# Patient Record
Sex: Male | Born: 1965 | Race: White | Hispanic: Yes | Marital: Married | Smoking: Never smoker
Health system: Southern US, Community
[De-identification: ages and names within clinical notes are randomized; demographics above are authoritative.]

## PROBLEM LIST (undated history)

## (undated) DIAGNOSIS — R42 Dizziness and giddiness: Secondary | ICD-10-CM

## (undated) DIAGNOSIS — I10 Essential (primary) hypertension: Secondary | ICD-10-CM

## (undated) HISTORY — PX: WRIST FRACTURE SURGERY: SHX121

## (undated) HISTORY — PX: MANDIBLE SURGERY: SHX707

## (undated) HISTORY — PX: SHOULDER ARTHROSCOPY: SHX128

---

## 2012-08-30 ENCOUNTER — Ambulatory Visit: Payer: Self-pay | Admitting: Family Medicine

## 2014-11-22 ENCOUNTER — Ambulatory Visit: Payer: Self-pay | Admitting: Physician Assistant

## 2015-10-25 ENCOUNTER — Ambulatory Visit
Admission: EM | Admit: 2015-10-25 | Discharge: 2015-10-25 | Disposition: A | Discharge status: Home / Self Care | Payer: 59 | Attending: Emergency Medicine | Admitting: Emergency Medicine

## 2015-10-25 DIAGNOSIS — R109 Unspecified abdominal pain: Secondary | ICD-10-CM | POA: Diagnosis not present

## 2015-10-25 HISTORY — DX: Essential (primary) hypertension: I10

## 2015-10-25 LAB — URINALYSIS COMPLETE WITH MICROSCOPIC (ARMC ONLY)
BACTERIA UA: NONE SEEN
BILIRUBIN URINE: NEGATIVE
Glucose, UA: NEGATIVE mg/dL
Hgb urine dipstick: NEGATIVE
Ketones, ur: NEGATIVE mg/dL
LEUKOCYTES UA: NEGATIVE
Nitrite: NEGATIVE
PH: 6 (ref 5.0–8.0)
Protein, ur: NEGATIVE mg/dL
SQUAMOUS EPITHELIAL / LPF: NONE SEEN
Specific Gravity, Urine: 1.025 (ref 1.005–1.030)
WBC UA: NONE SEEN WBC/hpf (ref 0–5)

## 2015-10-25 MED ORDER — METHOCARBAMOL 750 MG PO TABS: 1500.0000 mg | ORAL_TABLET | Freq: Three times a day (TID) | ORAL | Status: AC

## 2015-10-25 MED ORDER — KETOROLAC TROMETHAMINE 60 MG/2ML IM SOLN
60.0000 mg | Freq: Once | INTRAMUSCULAR | Status: AC
  Administered 2015-10-25: 60 mg via INTRAMUSCULAR

## 2015-10-25 MED ORDER — IBUPROFEN 800 MG PO TABS: 800.0000 mg | ORAL_TABLET | Freq: Three times a day (TID) | ORAL | Status: DC | PRN

## 2015-10-25 NOTE — Discharge Instructions (Signed)
To medication as prescribed. Alternate heat and ice for comfort. Stretch well. Avoid strainuous activity.  Follow-up with your primary care physician this week as needed. Return to urgent care proceed to ER for fever, abdominal pain, difficulty urinating, blood in urine, new or worsening concerns.  Flank Pain Flank pain refers to pain that is located on the side of the body between the upper abdomen and the back. The pain may occur over a short period of time (acute) or may be long-term or reoccurring (chronic). It may be mild or severe. Flank pain can be caused by many things. CAUSES  Some of the more common causes of flank pain include:  Muscle strains.   Muscle spasms.   A disease of your spine (vertebral disk disease).   A lung infection (pneumonia).   Fluid around your lungs (pulmonary edema).   A kidney infection.   Kidney stones.   A very painful skin rash caused by the chickenpox virus (shingles).   Gallbladder disease.  HOME CARE INSTRUCTIONS  Home care will depend on the cause of your pain. In general,  Rest as directed by your caregiver.  Drink enough fluids to keep your urine clear or pale yellow.  Only take over-the-counter or prescription medicines as directed by your caregiver. Some medicines may help relieve the pain.  Tell your caregiver about any changes in your pain.  Follow up with your caregiver as directed. SEEK IMMEDIATE MEDICAL CARE IF:   Your pain is not controlled with medicine.   You have new or worsening symptoms.  Your pain increases.   You have abdominal pain.   You have shortness of breath.   You have persistent nausea or vomiting.   You have swelling in your abdomen.   You feel faint or pass out.   You have blood in your urine.  You have a fever or persistent symptoms for more than 2-3 days.  You have a fever and your symptoms suddenly get worse. MAKE SURE YOU:   Understand these instructions.  Will watch  your condition.  Will get help right away if you are not doing well or get worse.   This information is not intended to replace advice given to you by your health care provider. Make sure you discuss any questions you have with your health care provider.   Document Released: 12/14/2005 Document Revised: 07/17/2012 Document Reviewed: 06/06/2012 Elsevier Interactive Patient Education [employer]2016 Elsevier Inc.

## 2015-10-25 NOTE — ED Notes (Signed)
Started approximately 9am today with right lateral abdomen/side pain, non radiating. Worse with any movement to the right.

## 2015-10-25 NOTE — ED Provider Notes (Signed)
[facility]  ____________________________________________  Time seen: Approximately 8:17 PM  I have reviewed the triage vital signs and the nursing notes.   HISTORY  Chief Complaint Abdominal Pain  HPI Justin Donnangel Beveridge Jr. is a 49 y.o. male  presents for the complaint of right flank pain since approximately 9 AM this morning. Patient states the pain is only in the right flank and does not radiate. Patient denies fall or known trauma. 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 Patient reports that he was at work at a onset of pain. Patient states he does not remember if some pain was present this morning when he woke up or not.  Patient states at this time when standing still pain is 0 out of 10. However reports with right twisting and bending forward pain is sharp and stabbing at 9 out of 10. Denies history of similar.  Denies abdominal pain, dysuria, fevers, recent sickness, hematuria, penile drainage, penile pain, testicular pain, rectal pain. Denies nausea, vomiting, diarrhea or changes in stool coloration, consistency. Denies rectal bleeding, blood in stool or blood in toilet.   Past Medical History  Diagnosis Date  . Hypertension     There are no active problems to display for this patient.   History reviewed. No pertinent past surgical history.  Current Outpatient Rx  Name  Route  Sig  Dispense  Refill  . fexofenadine (ALLEGRA) 30 MG tablet   Oral   Take 30 mg by mouth 2 (two) times daily.         . fluticasone (FLONASE) 50 MCG/ACT nasal spray   Each Nare   Place into both nostrils daily.         Marland Kitchen. triamterene-hydrochlorothiazide (DYAZIDE) 37.5-25 MG capsule   Oral   Take 1 capsule by mouth daily.           Allergies Shellfish allergy  History reviewed. No pertinent family history.  Social History Social History  Substance Use Topics  . Smoking status: Never Smoker    . Smokeless tobacco: None  . Alcohol Use: No    Review of Systems Constitutional: No fever/chills Eyes: No visual changes. ENT: No sore throat. Cardiovascular: Denies chest pain. Respiratory: Denies shortness of breath. Gastrointestinal: No abdominal pain.  No nausea, no vomiting.  No diarrhea.  No constipation. Positive right flank pain. Genitourinary: Negative for dysuria. Musculoskeletal: Negative for back pain. Skin: Negative for rash. Neurological: Negative for headaches, focal weakness or numbness.  10-point ROS otherwise negative.  ____________________________________________   PHYSICAL EXAM:  VITAL SIGNS: ED Triage Vitals  Enc Vitals Group     BP 10/25/15 1923 138/84 mmHg     Pulse Rate 10/25/15 1923 70     Resp 10/25/15 1923 16     Temp 10/25/15 1923 98.5 F (36.9 C)     Temp Source 10/25/15 1923 Tympanic     SpO2 10/25/15 1923 98 %     Weight 10/25/15 1923 212 lb (96.163 kg)     Height 10/25/15 1923 5\' 11"  (1.803 m)     Head Cir --      Peak Flow --      Pain Score 10/25/15 1927 10     Pain Loc --      Pain Edu? --      Excl. in GC? --     Constitutional: Alert and oriented. Well appearing and in no acute distress. Eyes: Conjunctivae are normal.  PERRL. EOMI. Head: Atraumatic.  Nose: No congestion/rhinnorhea.  Mouth/Throat: Mucous membranes are moist.  Oropharynx non-erythematous. Neck: No stridor.  No cervical spine tenderness to palpation. Hematological/Lymphatic/Immunilogical: No cervical lymphadenopathy. Cardiovascular: Normal rate, regular rhythm. Grossly normal heart sounds.  Good peripheral circulation. Respiratory: Normal respiratory effort.  No retractions. Lungs CTAB. No wheezes, rales or rhonchi. Gastrointestinal: Soft and nontender. No distention. Normal Bowel sounds.  Musculoskeletal: No lower or upper extremity tenderness nor edema.   Bilateral pedal pulses equal and easily palpated.  No cervical, thoracic or lumbar tenderness to  palpation. No midline tenderness. Right posterior to lateral mid thoracic mild tender to palpation, the pain in same area increases with right rotation and forward bending. No saddle anesthesia. Bilateral straight leg test negative. Steady gait. Changes positions from lying to sitting to standing quickly in room without distress. Neurologic:  Normal speech and language. No gross focal neurologic deficits are appreciated. No gait instability. Skin:  Skin is warm, dry and intact. No rash noted. Psychiatric: Mood and affect are normal. Speech and behavior are normal.  ____________________________________________   LABS (all labs ordered are listed, but only abnormal results are displayed)  Labs Reviewed  URINALYSIS COMPLETEWITH MICROSCOPIC ([facility] ONLY)     INITIAL IMPRESSION / ASSESSMENT AND PLAN / ED COURSE  Pertinent labs & imaging results that were available during my care of the patient were reviewed by me and considered in my medical decision making (see chart for details).   Very well-appearing patient. No acute distress. Presents for the complaint of right flank pain since 9 AM this morning. Denies fall or trauma. Denies abdominal pain, dysuria, fever or other complaints. Patient reports pain is only present with movement. Denies pain radiation. Patient states that if he sits still or stand still he has 0 pain. Pain is point reproducible by palpation as well as with movement per patient. No midline tenderness. Lungs clear throughout. Abdomen soft and nontender. Will evaluate urinalysis to evaluate for any bacteria as well as RBCs for hemoglobin. Suspect muscular strain. Doubt intraabdominal cause or nephrolithiasis as pain causes.   Urinalysis reviewed. Urinalysis normal. No RBCs or hemoglobin. Suspect muscular strain.  60 mg Toradol 1 urgent care. Discussed with patient to rest, ice, stretch and avoidance of overly strenuous activity. Patient does report that history of substance abuse  approximately 20 years ago which included narcotics as well as alcohol. Discussed patient use of muscle relaxants as needed for pain. Patient reports he is taking muscle relaxants in the past and tolerated them well. Will treat with when necessary ibuprofen as well as Robaxin and supportive treatments. Post IM toradol reports much improved and pain 0/10.   Discussed follow up with Primary care physician this week. Discussed follow up and return parameters including no resolution or any worsening concerns. Patient verbalized understanding and agreed to plan.   ____________________________________________   FINAL CLINICAL IMPRESSION(S) / ED DIAGNOSES  Final diagnoses:  Right flank pain       Renford Dills, NP 10/25/15 2108  Renford Dills, NP 10/25/15 2108

## 2015-11-10 ENCOUNTER — Encounter: Payer: Self-pay | Admitting: *Deleted

## 2015-11-10 ENCOUNTER — Ambulatory Visit
Admission: EM | Admit: 2015-11-10 | Discharge: 2015-11-10 | Disposition: A | Discharge status: Home / Self Care | Payer: 59 | Attending: Family Medicine | Admitting: Family Medicine

## 2015-11-10 DIAGNOSIS — S39012A Strain of muscle, fascia and tendon of lower back, initial encounter: Secondary | ICD-10-CM

## 2015-11-10 MED ORDER — KETOROLAC TROMETHAMINE 60 MG/2ML IM SOLN
60.0000 mg | Freq: Once | INTRAMUSCULAR | Status: AC
  Administered 2015-11-10: 60 mg via INTRAMUSCULAR

## 2015-11-10 MED ORDER — CYCLOBENZAPRINE HCL 10 MG PO TABS: 10.0000 mg | ORAL_TABLET | Freq: Three times a day (TID) | ORAL | Status: DC | PRN

## 2015-11-10 MED ORDER — IBUPROFEN 800 MG PO TABS: 800.0000 mg | ORAL_TABLET | Freq: Three times a day (TID) | ORAL | Status: DC

## 2015-11-10 NOTE — ED Notes (Signed)
Patient was working under a vehicle today at [employer] and when he stood up is lower back went out completely. Patient does have a history of back problems.

## 2015-11-10 NOTE — ED Provider Notes (Signed)
CSN: [id number]     Arrival date & time 11/10/15  1706 History   First MD Initiated Contact with Patient 11/10/15 1846     Chief Complaint  Patient presents with  . Back Pain   (Consider location/radiation/quality/duration/timing/severity/associated sxs/prior Treatment) Patient is a 50 y.o. male presenting with back pain. The history is provided by the patient.  Back Pain Location:  Lumbar spine Quality:  Aching Radiates to:  Does not radiate (denies radiating, numbness or tingling) Pain severity:  Severe Onset quality:  Sudden Duration:  4 hours Timing:  Constant Chronicity:  New Context: twisting   Context comment:  States he had just stood up from being in a laying down position on his back; had turned to start walking a felt sudden onset pain Associated symptoms: no abdominal pain, no abdominal swelling, no bladder incontinence, no bowel incontinence, no dysuria, no fever, no leg pain, no numbness, no paresthesias, no perianal numbness, no tingling and no weakness     Past Medical History  Diagnosis Date  . Hypertension    History reviewed. No pertinent past surgical history. History reviewed. No pertinent family history. Social History  Substance Use Topics  . Smoking status: Never Smoker   . Smokeless tobacco: Never Used  . Alcohol Use: No    Review of Systems  Constitutional: Negative for fever.  Gastrointestinal: Negative for abdominal pain and bowel incontinence.  Genitourinary: Negative for bladder incontinence and dysuria.  Musculoskeletal: Positive for back pain.  Neurological: Negative for tingling, weakness, numbness and paresthesias.    Allergies  Shellfish allergy  Home Medications   Prior to Admission medications   Medication Sig Start Date End Date Taking? Authorizing Provider  fexofenadine (ALLEGRA) 30 MG tablet Take 30 mg by mouth 2 (two) times daily.   Yes Historical Provider, MD  fluticasone (FLONASE) 50 MCG/ACT nasal spray Place into both  nostrils daily.   Yes Historical Provider, MD  triamterene-hydrochlorothiazide (DYAZIDE) 37.5-25 MG capsule Take 1 capsule by mouth daily.   Yes Historical Provider, MD  cyclobenzaprine (FLEXERIL) 10 MG tablet Take 1 tablet (10 mg total) by mouth 3 (three) times daily as needed for muscle spasms. 11/10/15   [name], MD  ibuprofen (ADVIL,MOTRIN) 800 MG tablet Take 1 tablet (800 mg total) by mouth 3 (three) times daily. 11/10/15   [name], MD   Meds Ordered and Administered this Visit   Medications  ketorolac (TORADOL) injection 60 mg (60 mg Intramuscular Given 11/10/15 1910)    BP 117/76 mmHg  Pulse 60  Temp(Src) 98.2 F (36.8 C) (Oral)  Resp 20  Ht 5\' 11"  (1.803 m)  Wt 212 lb (96.163 kg)  BMI 29.58 kg/m2  SpO2 100% No data found.   Physical Exam  Constitutional: He appears well-developed and well-nourished. No distress.  Musculoskeletal:       Lumbar back: He exhibits decreased range of motion, tenderness and spasm. He exhibits no bony tenderness, no swelling, no edema, no deformity, no laceration, no pain and normal pulse.  Skin: He is not diaphoretic.  Nursing note and vitals reviewed.   ED Course  Procedures (including critical care time)  Labs Review Labs Reviewed - No data to display  Imaging Review No results found.   Visual Acuity Review  Right Eye Distance:   Left Eye Distance:   Bilateral Distance:    Right Eye Near:   Left Eye Near:    Bilateral Near:         MDM   1. Low back  strain, initial encounter    Discharge Medication List as of 11/10/2015  7:27 PM    START taking these medications   Details  cyclobenzaprine (FLEXERIL) 10 MG tablet Take 1 tablet (10 mg total) by mouth 3 (three) times daily as needed for muscle spasms., Starting 11/10/2015, Until Discontinued, Normal       Discharge Medication List as of 11/10/2015  7:27 PM    START taking these medications   Details  cyclobenzaprine (FLEXERIL) 10 MG tablet Take 1 tablet (10 mg  total) by mouth 3 (three) times daily as needed for muscle spasms., Starting 11/10/2015, Until Discontinued, Normal       1. diagnosis reviewed with patient 2. Patient given Toradol 60mg  IM x1 in clinic 3.  rx as per orders above; reviewed possible side effects, interactions, risks and benefits  4. Recommend supportive treatment with rest, gentle stretching, heat 5. Follow-up prn if symptoms worsen or don't improve    Payton Mccallumrlando Hilberto Burzynski, MD 11/10/15 1932

## 2015-12-21 ENCOUNTER — Ambulatory Visit
Admission: EM | Admit: 2015-12-21 | Discharge: 2015-12-21 | Disposition: A | Discharge status: Home / Self Care | Payer: 59 | Attending: Family Medicine | Admitting: Family Medicine

## 2015-12-21 DIAGNOSIS — G44209 Tension-type headache, unspecified, not intractable: Secondary | ICD-10-CM | POA: Diagnosis not present

## 2015-12-21 DIAGNOSIS — B349 Viral infection, unspecified: Secondary | ICD-10-CM | POA: Diagnosis not present

## 2015-12-21 LAB — RAPID INFLUENZA A&B ANTIGENS (ARMC ONLY): INFLUENZA A (ARMC): NOT DETECTED

## 2015-12-21 LAB — RAPID INFLUENZA A&B ANTIGENS: Influenza B (ARMC): NOT DETECTED

## 2015-12-21 MED ORDER — KETOROLAC TROMETHAMINE 60 MG/2ML IM SOLN
60.0000 mg | Freq: Once | INTRAMUSCULAR | Status: AC
  Administered 2015-12-21: 60 mg via INTRAMUSCULAR

## 2015-12-21 MED ORDER — CYCLOBENZAPRINE HCL 10 MG PO TABS: 10.0000 mg | ORAL_TABLET | Freq: Every day | ORAL | Status: DC

## 2015-12-21 MED ORDER — IBUPROFEN 800 MG PO TABS: 800.0000 mg | ORAL_TABLET | Freq: Three times a day (TID) | ORAL | Status: DC

## 2015-12-21 NOTE — ED Notes (Signed)
Started yesterday morning with "my bones aching and my head feels like it's being squeezed"

## 2015-12-21 NOTE — ED Provider Notes (Signed)
CSN: [id number]     Arrival date & time 12/21/15  1740 History   First MD Initiated Contact with Patient 12/21/15 1818     Chief Complaint  Patient presents with  . Headache   (Consider location/radiation/quality/duration/timing/severity/associated sxs/prior Treatment) HPI Comments: 50 yo male with a 1 day h/o joint, body and muscle aches, mainly to his legs, fatigue, chills, headache. Headache is all around the head. Denies "worst headache of his life", vision changes, nausea, vomiting, numbness/tingling, chest pains, shortness of breath.   The history is provided by the patient.    Past Medical History  Diagnosis Date  . Hypertension    History reviewed. No pertinent past surgical history. Family History  Problem Relation Age of Onset  . Diabetes Mother    Social History  Substance Use Topics  . Smoking status: Never Smoker   . Smokeless tobacco: Never Used  . Alcohol Use: No    Review of Systems  Allergies  Shellfish allergy  Home Medications   Prior to Admission medications   Medication Sig Start Date End Date Taking? Authorizing Provider  fexofenadine (ALLEGRA) 30 MG tablet Take 30 mg by mouth 2 (two) times daily.   Yes Historical Provider, MD  fluticasone (FLONASE) 50 MCG/ACT nasal spray Place into both nostrils daily.   Yes Historical Provider, MD  triamterene-hydrochlorothiazide (DYAZIDE) 37.5-25 MG capsule Take 1 capsule by mouth daily.   Yes Historical Provider, MD  cyclobenzaprine (FLEXERIL) 10 MG tablet Take 1 tablet (10 mg total) by mouth at bedtime. prn 12/21/15   [name], MD  ibuprofen (ADVIL,MOTRIN) 800 MG tablet Take 1 tablet (800 mg total) by mouth 3 (three) times daily. 12/21/15   [name], MD   Meds Ordered and Administered this Visit   Medications  ketorolac (TORADOL) injection 60 mg (60 mg Intramuscular Given 12/21/15 1839)    BP 127/84 mmHg  Pulse 70  Temp(Src) 99.4 F (37.4 C) (Tympanic)  Resp 16  Ht  (1.778 m)  Wt 210 lb  (95.255 kg)  BMI 30.13 kg/m2  SpO2 100% No data found.   Physical Exam  Constitutional: He appears well-developed and well-nourished. No distress.  HENT:  Head: Normocephalic and atraumatic.  Right Ear: Tympanic membrane, external ear and ear canal normal.  Left Ear: Tympanic membrane, external ear and ear canal normal.  Nose: Nose normal.  Mouth/Throat: Uvula is midline, oropharynx is clear and moist and mucous membranes are normal. No oropharyngeal exudate or tonsillar abscesses.  Eyes: Conjunctivae and EOM are normal. Pupils are equal, round, and reactive to light. Right eye exhibits no discharge. Left eye exhibits no discharge. No scleral icterus.  Neck: Normal range of motion. Neck supple. No tracheal deviation present. No thyromegaly present.  Cardiovascular: Normal rate, regular rhythm and normal heart sounds.   Pulmonary/Chest: Effort normal and breath sounds normal. No stridor. No respiratory distress. He has no wheezes. He has no rales. He exhibits no tenderness.  Lymphadenopathy:    He has no cervical adenopathy.  Neurological: He is alert.  Skin: Skin is warm and dry. No rash noted. He is not diaphoretic.  Nursing note and vitals reviewed.   ED Course  Procedures (including critical care time)  Labs Review Labs Reviewed  RAPID INFLUENZA A&B ANTIGENS [facility] ONLY)    Imaging Review No results found.   Visual Acuity Review  Right Eye Distance:   Left Eye Distance:   Bilateral Distance:    Right Eye Near:   Left Eye Near:  Bilateral Near:         MDM   1. Tension-type headache, not intractable, unspecified chronicity pattern   2. Viral syndrome    Discharge Medication List as of 12/21/2015  7:13 PM      1. Lab results and diagnosis reviewed with patient 2. rx as per orders above; reviewed possible side effects, interactions, risks and benefits; rx given for ibuprofen  and flexeril  as per orders 3. Recommend supportive treatment with  increased fluids, rest  4. Follow-up prn if symptoms worsen or don't improve  [name], MD 12/21/15 2053

## 2015-12-22 ENCOUNTER — Emergency Department: Payer: 59

## 2015-12-22 ENCOUNTER — Emergency Department
Admission: EM | Admit: 2015-12-22 | Discharge: 2015-12-23 | Disposition: A | Discharge status: Home / Self Care | Payer: 59 | Attending: Emergency Medicine | Admitting: Emergency Medicine

## 2015-12-22 ENCOUNTER — Encounter: Payer: Self-pay | Admitting: Emergency Medicine

## 2015-12-22 DIAGNOSIS — R630 Anorexia: Secondary | ICD-10-CM | POA: Insufficient documentation

## 2015-12-22 DIAGNOSIS — Z79899 Other long term (current) drug therapy: Secondary | ICD-10-CM | POA: Insufficient documentation

## 2015-12-22 DIAGNOSIS — I10 Essential (primary) hypertension: Secondary | ICD-10-CM | POA: Insufficient documentation

## 2015-12-22 DIAGNOSIS — R519 Headache, unspecified: Secondary | ICD-10-CM

## 2015-12-22 DIAGNOSIS — Z7951 Long term (current) use of inhaled steroids: Secondary | ICD-10-CM | POA: Diagnosis not present

## 2015-12-22 DIAGNOSIS — R51 Headache: Secondary | ICD-10-CM | POA: Diagnosis present

## 2015-12-22 LAB — CBC WITH DIFFERENTIAL/PLATELET
BASOS PCT: 1 %
Basophils Absolute: 0.1 10*3/uL (ref 0–0.1)
Eosinophils Absolute: 0.2 10*3/uL (ref 0–0.7)
Eosinophils Relative: 2 %
HEMATOCRIT: 35.8 % — AB (ref 40.0–52.0)
Hemoglobin: 12.4 g/dL — ABNORMAL LOW (ref 13.0–18.0)
LYMPHS ABS: 1.2 10*3/uL (ref 1.0–3.6)
Lymphocytes Relative: 15 %
MCH: 30.2 pg (ref 26.0–34.0)
MCHC: 34.6 g/dL (ref 32.0–36.0)
MCV: 87.3 fL (ref 80.0–100.0)
MONO ABS: 0.5 10*3/uL (ref 0.2–1.0)
MONOS PCT: 6 %
NEUTROS ABS: 6.2 10*3/uL (ref 1.4–6.5)
Neutrophils Relative %: 76 %
Platelets: 213 10*3/uL (ref 150–440)
RBC: 4.1 MIL/uL — ABNORMAL LOW (ref 4.40–5.90)
RDW: 13.5 % (ref 11.5–14.5)
WBC: 8.1 10*3/uL (ref 3.8–10.6)

## 2015-12-22 LAB — BASIC METABOLIC PANEL
Anion gap: 10 (ref 5–15)
BUN: 20 mg/dL (ref 6–20)
CALCIUM: 9.4 mg/dL (ref 8.9–10.3)
CHLORIDE: 103 mmol/L (ref 101–111)
CO2: 28 mmol/L (ref 22–32)
CREATININE: 1.3 mg/dL — AB (ref 0.61–1.24)
GFR calc non Af Amer: >60 mL/min (ref >60)
GLUCOSE: 128 mg/dL — AB (ref 65–99)
Potassium: 3.5 mmol/L (ref 3.5–5.1)
SODIUM: 141 mmol/L (ref 135–145)

## 2015-12-22 MED ORDER — KETOROLAC TROMETHAMINE 30 MG/ML IJ SOLN
30.0000 mg | Freq: Once | INTRAMUSCULAR | Status: AC
  Administered 2015-12-22: 30 mg via INTRAVENOUS
  Filled 2015-12-22: qty 1

## 2015-12-22 MED ORDER — PROMETHAZINE HCL 25 MG PO TABS: 25.0000 mg | ORAL_TABLET | Freq: Four times a day (QID) | ORAL | Status: DC | PRN

## 2015-12-22 MED ORDER — SODIUM CHLORIDE 0.9 % IV BOLUS (SEPSIS)
1000.0000 mL | Freq: Once | INTRAVENOUS | Status: AC
  Administered 2015-12-22: 1000 mL via INTRAVENOUS

## 2015-12-22 MED ORDER — PROMETHAZINE HCL 25 MG/ML IJ SOLN
25.0000 mg | Freq: Once | INTRAMUSCULAR | Status: AC
  Administered 2015-12-22: 25 mg via INTRAVENOUS
  Filled 2015-12-22: qty 1

## 2015-12-22 NOTE — ED Notes (Signed)
Promethazine mixed in 50cc bag of NS

## 2015-12-22 NOTE — ED Provider Notes (Signed)
[facility] Emergency Department Provider Note  ____________________________________________  Time seen: Approximately 10:22 PM  I have reviewed the triage vital  and the nursing notes.   HISTORY  Chief Complaint Headache   HPI Justin Woodward. is a 50 y.o. male who was in his usual state of good health when he began to develop whole-body "bone" aches and headache at 9 AM at work on 2/12. 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 He denies any known fevers, nausea, vomiting. He does report decreased appetite. He does not typically get headaches.  Pain is worse with movement, turning his head.  It is also worsened by light. He states his bones below his waist feel "chilly."  He denies neck pain, numbness or weakness.  He was seen at urgent care yesterday and was given Toradol, Flexeril, and ibuprofen with no relief. His flu swab was negative.  His not had any upper respiratory symptoms.  Past Medical History  Diagnosis Date  . Hypertension     There are no active problems to display for this patient.   History reviewed. No pertinent past surgical history.  Current Outpatient Rx  Name  Route  Sig  Dispense  Refill  . cyclobenzaprine (FLEXERIL) 10 MG tablet   Oral   Take 1 tablet (10 mg total) by mouth at bedtime. prn   30 tablet   0   . fexofenadine (ALLEGRA) 30 MG tablet   Oral   Take 30 mg by mouth 2 (two) times daily.         . fluticasone (FLONASE) 50 MCG/ACT nasal spray   Each Nare   Place into both nostrils daily.         Marland Kitchen ibuprofen (ADVIL,MOTRIN) 800 MG tablet   Oral   Take 1 tablet (800 mg total) by mouth 3 (three) times daily.   30 tablet   0   . triamterene-hydrochlorothiazide (DYAZIDE) 37.5-25 MG capsule   Oral   Take 1 capsule by mouth daily.           Allergies Corn-containing products and Shellfish allergy  Family History  Problem Relation Age of Onset  . Diabetes Mother      Social History Social History  Substance Use Topics  . Smoking status: Never Smoker   . Smokeless tobacco: Never Used  . Alcohol Use: No    Review of Systems Constitutional: No fever Eyes: No visual changes. ENT: No sore throat. Cardiovascular: Denies chest pain. Respiratory: Denies shortness of breath. Gastrointestinal: No abdominal pain.  No nausea, no vomiting.  No diarrhea.  Musculoskeletal: Negative for back pain. Skin: Negative for rash. Neurological: See history of present illness 10-point ROS otherwise negative.  ____________________________________________   PHYSICAL EXAM:  VITAL SIGNS: ED Triage Vitals  Enc Vitals Group     BP 12/22/15 2031 134/82 mmHg     Pulse Rate 12/22/15 2031 74     Resp 12/22/15 2031 16     Temp 12/22/15 2031 98.9 F (37.2 C)     Temp Source 12/22/15 2031 Oral     SpO2 12/22/15 2031 97 %     Weight 12/22/15 2031 210 lb (95.255 kg)     Height 12/22/15 2031  (1.778 m)     Head Cir --      Peak Flow --      Pain Score 12/22/15 2032 10     Pain Loc --      Pain  Edu? --      Excl. in GC? --     Constitutional: Alert and oriented. Well appearing and in no acute distress. Eyes: Conjunctivae are normal. PERRL. EOMI. Head: Atraumatic. Nose: No congestion/rhinnorhea. Mouth/Throat: Mucous membranes are moist.  Oropharynx non-erythematous. Neck: No stridor.  No cervical spine tenderness to palpation. No neck stiffness Hematological/Lymphatic/Immunilogical: No cervical lymphadenopathy. Cardiovascular: Normal rate, regular rhythm. Grossly normal heart sounds.  Good peripheral circulation. Respiratory: Normal respiratory effort.  No retractions. Lungs CTAB. Gastrointestinal: Soft and nontender. No distention. No abdominal bruits. No CVA tenderness. Musculoskeletal: No lower extremity tenderness nor edema.  No joint effusions. Neurologic:  Normal speech and language. No gross focal neurologic deficits are appreciated. No gait  instability. Skin:  Skin is warm, dry and intact. No rash noted. Psychiatric: Mood and affect are normal. Speech and behavior are normal.  ____________________________________________   LABS (all labs ordered are listed, but only abnormal results are displayed)  Labs Reviewed  BASIC METABOLIC PANEL - Abnormal; Notable for the following:    Glucose, Bld 128 (*)    Creatinine, Ser 1.30 (*)    All other components within normal limits  CBC WITH DIFFERENTIAL/PLATELET - Abnormal; Notable for the following:    RBC 4.10 (*)    Hemoglobin 12.4 (*)    HCT 35.8 (*)    All other components within normal limits  URINALYSIS COMPLETEWITH MICROSCOPIC ([facility] ONLY)   ____________________________________________  RADIOLOGY  CT head-There is mucoperiosteal thickening with partial opacification of the right ethmoid air cells and right frontal sinus. The mastoid air cells are clear. The calvarium is intact.  IMPRESSION: No acute intracranial pathology ________________________________________________   INITIAL IMPRESSION / ASSESSMENT AND PLAN / ED COURSE  Pertinent labs & imaging results that were available during my care of the patient were reviewed by me and considered in my medical decision making (see chart for details).  Patient and family extensively counseled about benefit and risk of lumbar puncture to evaluate for SAH or meningitis as I cannot r/o these Dx with H&P or assessment to this point.  Pt & family understand risk & benefit of procedure including possibly missing life threatening diagnosis.  Pt wants medication for pain.  Will try phenergan/toradol & reassess.  ED care transferred to Dr. Fanny Bien. ____________________________________________   FINAL CLINICAL IMPRESSION(S) / ED DIAGNOSES  Headache   Maurilio Lovely, MD 12/22/15 2350

## 2015-12-22 NOTE — ED Notes (Signed)
Pt does not have to void at this time.

## 2015-12-22 NOTE — ED Notes (Signed)
Patient returned from CT

## 2015-12-22 NOTE — Discharge Instructions (Signed)
The cause of your headache is unclear.  We discussed that the only way to rule out a subtle head bleed (subarachnoid aneurysm) & meninginitis is to perform a lumbar puncture which you decided not to consent to; these are life threatening conditions & can cause you to die if they are not recognized & treated.  Please return if you change your mind & are willing to have this done, you develop new or worsening pain, vomiting, fever, stiff neck, change in your vision, numbness/weakness of your arms or legs, or for any other concerns.    General Headache Without Cause A headache is pain or discomfort felt around the head or neck area. The specific cause of a headache may not be found. There are many causes and types of headaches. A few common ones are:  Tension headaches.  Migraine headaches.  Cluster headaches.  Chronic daily headaches. HOME CARE INSTRUCTIONS  Watch your condition for any changes. Take these steps to help with your condition: Managing Pain  Take over-the-counter and prescription medicines only as told by your health care provider.  Lie down in a dark, quiet room when you have a headache.  If directed, apply ice to the head and neck area:  Put ice in a plastic bag.  Place a towel between your skin and the bag.  Leave the ice on for 20 minutes, 2-3 times per day.  Use a heating pad or hot shower to apply heat to the head and neck area as told by your health care provider.  Keep lights dim if bright lights bother you or make your headaches worse. Eating and Drinking  Eat meals on a regular schedule.  Limit alcohol use.  Decrease the amount of caffeine you drink, or stop drinking caffeine. General Instructions  Keep all follow-up visits as told by your health care provider. This is important.  Keep a headache journal to help find out what may trigger your headaches. For example, write down:  What you eat and drink.  How much sleep you get.  Any change to your  diet or medicines.  Try massage or other relaxation techniques.  Limit stress.  Sit up straight, and do not tense your muscles.  Do not use tobacco products, including cigarettes, chewing tobacco, or e-cigarettes. If you need help quitting, ask your health care provider.  Exercise regularly as told by your health care provider.  Sleep on a regular schedule. Get 7-9 hours of sleep, or the amount recommended by your health care provider. SEEK MEDICAL CARE IF:   Your symptoms are not helped by medicine.  You have a headache that is different from the usual headache.  You have nausea or you vomit.  You have a fever. SEEK IMMEDIATE MEDICAL CARE IF:   Your headache becomes severe.  You have repeated vomiting.  You have a stiff neck.  You have a loss of vision.  You have problems with speech.  You have pain in the eye or ear.  You have muscular weakness or loss of muscle control.  You lose your balance or have trouble walking.  You feel faint or pass out.  You have confusion.   This information is not intended to replace advice given to you by your health care provider. Make sure you discuss any questions you have with your health care provider.   Document Released: 10/23/2005 Document Revised: 07/14/2015 Document Reviewed: 02/15/2015 Elsevier Interactive Patient Education [employer]

## 2015-12-22 NOTE — ED Notes (Signed)
Pt c/o severe headache since 9am Monday - Pt c/o bones from waste down aching and cold - Pt went to urgent care yesterday and had a negative flu test - Denies nausea, vomiting, diarrhea

## 2015-12-22 NOTE — ED Notes (Signed)
C/O headache x 2 days.  Onset of symptoms Monday morning at 0900.  Seen at Urgent care yesterday.  Given Toradol, Flexeril and Ibuprofen.  No relief.  Flu swab was negative.  C.O pain all over head and "bones from waist down are achy".  AAOx3.  Skin warm and dry.  MAE equally and strong.

## 2015-12-22 NOTE — ED Notes (Signed)
Patient transported to CT 

## 2015-12-23 DIAGNOSIS — R51 Headache: Secondary | ICD-10-CM | POA: Diagnosis not present

## 2015-12-23 NOTE — ED Notes (Signed)
Pt refused urine sample.

## 2015-12-23 NOTE — ED Provider Notes (Signed)
-----------------------------------------   1:32 AM on 12/23/2015 -----------------------------------------  Patient does report improvement in his headache, still does have a slight headache but reports his chills and body aches seemed to improve.  I again counseled strongly recommended, and offered lumbar puncture to exclude infection including "life-threatening meningitis." Although clinically the patient does not have controlled accentuation or clear meningismus, see is considered as well as the possibility of "life-threatening aneurysm". Both risks were discussed with the patient as well as recommendations, but he states he wished to be discharged refuses further evaluation the emergency room. He clearly understands the risks of leaving and my recommendations for lumbar puncture to exclude life-threatening diagnoses.  Patient being discharged. Family will be driving him home. He does request medication, for which I will give him a prescription for Phenergan and discussed no driving while using this medication.  Careful return precautions including immediate return to emergency room if he develops any fever, worsening headache, numbness tingling weakness, severe worsening of headache, neck pain and stiffness, or other new concerns discussed with the patient and family.  Patient will follow-up with his primary care doctor.  Sharyn Creamer, MD 12/23/15 [id number]

## 2015-12-25 ENCOUNTER — Emergency Department: Payer: 59

## 2015-12-25 ENCOUNTER — Emergency Department
Admission: EM | Admit: 2015-12-25 | Discharge: 2015-12-25 | Disposition: A | Discharge status: Home / Self Care | Payer: 59 | Attending: Emergency Medicine | Admitting: Emergency Medicine

## 2015-12-25 ENCOUNTER — Encounter: Payer: Self-pay | Admitting: Emergency Medicine

## 2015-12-25 DIAGNOSIS — R51 Headache: Secondary | ICD-10-CM

## 2015-12-25 DIAGNOSIS — J018 Other acute sinusitis: Secondary | ICD-10-CM | POA: Diagnosis not present

## 2015-12-25 DIAGNOSIS — R519 Headache, unspecified: Secondary | ICD-10-CM

## 2015-12-25 DIAGNOSIS — Z79899 Other long term (current) drug therapy: Secondary | ICD-10-CM | POA: Diagnosis not present

## 2015-12-25 DIAGNOSIS — Z7951 Long term (current) use of inhaled steroids: Secondary | ICD-10-CM | POA: Diagnosis not present

## 2015-12-25 DIAGNOSIS — Z791 Long term (current) use of non-steroidal anti-inflammatories (NSAID): Secondary | ICD-10-CM | POA: Diagnosis not present

## 2015-12-25 DIAGNOSIS — I10 Essential (primary) hypertension: Secondary | ICD-10-CM | POA: Diagnosis not present

## 2015-12-25 LAB — CBC WITH DIFFERENTIAL/PLATELET
BASOS PCT: 1 %
Basophils Absolute: 0.1 10*3/uL (ref 0–0.1)
EOS ABS: 0.2 10*3/uL (ref 0–0.7)
Eosinophils Relative: 2 %
HCT: 39.5 % — ABNORMAL LOW (ref 40.0–52.0)
HEMOGLOBIN: 13.8 g/dL (ref 13.0–18.0)
Lymphocytes Relative: 24 %
Lymphs Abs: 1.9 10*3/uL (ref 1.0–3.6)
MCH: 29.9 pg (ref 26.0–34.0)
MCHC: 34.9 g/dL (ref 32.0–36.0)
MCV: 85.7 fL (ref 80.0–100.0)
MONOS PCT: 8 %
Monocytes Absolute: 0.6 10*3/uL (ref 0.2–1.0)
NEUTROS ABS: 5 10*3/uL (ref 1.4–6.5)
NEUTROS PCT: 65 %
Platelets: 237 10*3/uL (ref 150–440)
RBC: 4.61 MIL/uL (ref 4.40–5.90)
RDW: 13.4 % (ref 11.5–14.5)
WBC: 7.8 10*3/uL (ref 3.8–10.6)

## 2015-12-25 LAB — COMPREHENSIVE METABOLIC PANEL
ALBUMIN: 4.6 g/dL (ref 3.5–5.0)
ALT: 19 U/L (ref 17–63)
ANION GAP: 6 (ref 5–15)
AST: 19 U/L (ref 15–41)
Alkaline Phosphatase: 44 U/L (ref 38–126)
BUN: 18 mg/dL (ref 6–20)
CHLORIDE: 101 mmol/L (ref 101–111)
CO2: 30 mmol/L (ref 22–32)
Calcium: 10 mg/dL (ref 8.9–10.3)
Creatinine, Ser: 1.15 mg/dL (ref 0.61–1.24)
GFR calc non Af Amer: >60 mL/min (ref >60)
GLUCOSE: 96 mg/dL (ref 65–99)
POTASSIUM: 3.7 mmol/L (ref 3.5–5.1)
SODIUM: 137 mmol/L (ref 135–145)
Total Bilirubin: 0.6 mg/dL (ref 0.3–1.2)
Total Protein: 7.7 g/dL (ref 6.5–8.1)

## 2015-12-25 LAB — SEDIMENTATION RATE: SED RATE: 15 mm/h (ref 0–20)

## 2015-12-25 MED ORDER — AMOXICILLIN-POT CLAVULANATE 875-125 MG PO TABS
1.0000 | ORAL_TABLET | Freq: Once | ORAL | Status: AC
  Administered 2015-12-25: 1 via ORAL
  Filled 2015-12-25: qty 1

## 2015-12-25 MED ORDER — HYDROMORPHONE HCL 1 MG/ML IJ SOLN
0.5000 mg | Freq: Once | INTRAMUSCULAR | Status: AC
  Administered 2015-12-25: 0.5 mg via INTRAVENOUS
  Filled 2015-12-25: qty 1

## 2015-12-25 MED ORDER — SODIUM CHLORIDE 0.9 % IV SOLN
Freq: Once | INTRAVENOUS | Status: AC
  Administered 2015-12-25: 14:00:00 via INTRAVENOUS

## 2015-12-25 MED ORDER — AMOXICILLIN-POT CLAVULANATE 875-125 MG PO TABS: 1.0000 | ORAL_TABLET | Freq: Two times a day (BID) | ORAL | Status: AC

## 2015-12-25 MED ORDER — ONDANSETRON HCL 4 MG/2ML IJ SOLN
4.0000 mg | Freq: Once | INTRAMUSCULAR | Status: AC
  Administered 2015-12-25: 4 mg via INTRAVENOUS
  Filled 2015-12-25: qty 2

## 2015-12-25 MED ORDER — OXYCODONE-ACETAMINOPHEN 5-325 MG PO TABS: 1.0000 | ORAL_TABLET | Freq: Four times a day (QID) | ORAL | Status: DC | PRN

## 2015-12-25 NOTE — ED Notes (Signed)
States has had severe headache since Monday, states was seen at [facility] urgent care on Tuesday. States on Wednesday here in the ER, had CT scan at that time. Denies injury. Denies fevers. States headache continues.

## 2015-12-25 NOTE — ED Provider Notes (Signed)
[facility] Emergency Department Provider Note  ____________________________________________  Time seen: Approximately 1:44 PM  I have reviewed the triage vital signs and the nursing notes.   HISTORY  Chief Complaint Headache   HPI Justin Woodward. is a 50 y.o. male reports progression of headache starting last week and getting worse every day until today. He saw urgent care on Monday and came here on Tuesday had a CT scan negative for any pathology except for some apparent mucosal thickening. he refused LP at that time. Patient reports that the headache which is similar nature to yesterday feeling like a tight band inside his skull is gotten worse movement of the head makes it worse getting up makes it worse but then it subsides after a few minutes to just a bad pounding sleeping makes it slightly better. Patient denies any fever patient denies any cough patient has not had any other problems. Patient does report he went to [facility] fluid and back out again recently. Patient has not any head trauma. Patient has not had a headache like this before.   Past Medical History  Diagnosis Date  . Hypertension     There are no active problems to display for this patient.   Past Surgical History  Procedure Laterality Date  . Mandible surgery    . Wrist fracture surgery      Current Outpatient Rx  Name  Route  Sig  Dispense  Refill  . amoxicillin-clavulanate (AUGMENTIN) 875-125 MG tablet   Oral   Take 1 tablet by mouth every 12 (twelve) hours.   20 tablet   0   . cyclobenzaprine (FLEXERIL) 10 MG tablet   Oral   Take 1 tablet (10 mg total) by mouth at bedtime. prn   30 tablet   0   . fexofenadine (ALLEGRA) 30 MG tablet   Oral   Take 30 mg by mouth 2 (two) times daily.         . fluticasone (FLONASE) 50 MCG/ACT nasal spray   Each Nare   Place into both nostrils daily.         Marland Kitchen ibuprofen (ADVIL,MOTRIN) 800 MG tablet   Oral   Take 1 tablet (800 mg  total) by mouth 3 (three) times daily.   30 tablet   0   . oxyCODONE-acetaminophen (ROXICET) 5-325 MG tablet   Oral   Take 1 tablet by mouth every 6 (six) hours as needed for severe pain.   6 tablet   0   . promethazine (PHENERGAN) 25 MG tablet   Oral   Take 1 tablet (25 mg total) by mouth every 6 (six) hours as needed for nausea or vomiting.   30 tablet   0   . triamterene-hydrochlorothiazide (DYAZIDE) 37.5-25 MG capsule   Oral   Take 1 capsule by mouth daily.           Allergies Corn-containing products and Shellfish allergy  Family History  Problem Relation Age of Onset  . Diabetes Mother     Social History Social History  Substance Use Topics  . Smoking status: Never Smoker   . Smokeless tobacco: Never Used  . Alcohol Use: No    Review of Systems Constitutional: No fever/chills Eyes: No visual changes. ENT: No sore throat. Cardiovascular: Denies chest pain. Respiratory: Denies shortness of breath. Gastrointestinal: No abdominal pain.  No nausea, no vomiting.  No diarrhea.  No constipation. Genitourinary: Negative for dysuria. Musculoskeletal: Negative for back pain. Skin: Negative for rash. Neurological:  Negative for focal weakness or numbness.  10-point ROS otherwise negative.  ____________________________________________   PHYSICAL EXAM:  VITAL SIGNS: ED Triage Vitals  Enc Vitals Group     BP 12/25/15 0948 142/85 mmHg     Pulse Rate 12/25/15 0948 96     Resp 12/25/15 0948 20     Temp 12/25/15 0948 98.8 F (37.1 C)     Temp Source 12/25/15 0948 Oral     SpO2 12/25/15 0948 97 %     Weight 12/25/15 0948 210 lb (95.255 kg)     Height 12/25/15 0948  (1.778 m)     Head Cir --      Peak Flow --      Pain Score 12/25/15 0948 10     Pain Loc --      Pain Edu? --      Excl. in GC? --     Constitutional: Alert and oriented. Well appearing and in no acute distress. Eyes: Conjunctivae are normal. PERRL. EOMI. Head: Atraumatic. Nose: No  congestion/rhinnorhea. Mouth/Throat: Mucous membranes are moist.  Oropharynx non-erythematous. Neck: No stridor.  Cardiovascular: Normal rate, regular rhythm. Grossly normal heart sounds.  Good peripheral circulation. Respiratory: Normal respiratory effort.  No retractions. Lungs CTAB. Musculoskeletal: No lower extremity tenderness nor edema.  No joint effusions. Neurologic:  Normal speech and language. No gross focal neurologic deficits are appreciated. Cranial nerves II through XII are intact cerebellar finger-nose rapid alternating movements are normal her strength is 5 over 5 throughout sensation is intact throughout No gait instability. Skin:  Skin is warm, dry and intact. No rash noted. Psychiatric: Mood and affect are normal. Speech and behavior are normal.  ____________________________________________   LABS (all labs ordered are listed, but only abnormal results are displayed)  Labs Reviewed  CBC WITH DIFFERENTIAL/PLATELET - Abnormal; Notable for the following:    HCT 39.5 (*)    All other components within normal limits  COMPREHENSIVE METABOLIC PANEL  SEDIMENTATION RATE   ____________________________________________  EKG  ____________________________________________  RADIOLOGY  MRI is negative except for bilateral sinusitis now with a left-sided effusion ____________________________________________   PROCEDURES    ____________________________________________   INITIAL IMPRESSION / ASSESSMENT AND PLAN / ED COURSE  Pertinent labs & imaging results that were available during my care of the patient were reviewed by me and considered in my medical decision making (see chart for details).   ____________________________________________   FINAL CLINICAL IMPRESSION(S) / ED DIAGNOSES  Final diagnoses:  Other acute sinusitis  Acute nonintractable headache, unspecified headache type      Arnaldo Natal, MD 12/25/15 1614

## 2015-12-25 NOTE — ED Notes (Signed)
MD at bedside to explain discharge.

## 2015-12-25 NOTE — ED Notes (Signed)
Patient transported to MRI 

## 2015-12-25 NOTE — ED Notes (Signed)
Pt asleep. Waiting on MD exam.

## 2015-12-25 NOTE — Discharge Instructions (Signed)
Sinusitis, Adult °Sinusitis is redness, soreness, and inflammation of the paranasal sinuses. Paranasal sinuses are air pockets within the bones of your face. They are located beneath your eyes, in the middle of your forehead, and above your eyes. In healthy paranasal sinuses, mucus is able to drain out, and air is able to circulate through them by way of your nose. However, when your paranasal sinuses are inflamed, mucus and air can become trapped. This can allow bacteria and other germs to grow and cause infection. °Sinusitis can develop quickly and last only a short time (acute) or continue over a long period (chronic). Sinusitis that lasts for more than 12 weeks is considered chronic. °CAUSES °Causes of sinusitis include: °· Allergies. °· Structural abnormalities, such as displacement of the cartilage that separates your nostrils (deviated septum), which can decrease the air flow through your nose and sinuses and affect sinus drainage. °· Functional abnormalities, such as when the small hairs (cilia) that line your sinuses and help remove mucus do not work properly or are not present. °SIGNS AND SYMPTOMS °Symptoms of acute and chronic sinusitis are the same. The primary symptoms are pain and pressure around the affected sinuses. Other symptoms include: °· Upper toothache. °· Earache. °· Headache. °· Bad breath. °· Decreased sense of smell and taste. °· A cough, which worsens when you are lying flat. °· Fatigue. °· Fever. °· Thick drainage from your nose, which often is green and may contain pus (purulent). °· Swelling and warmth over the affected sinuses. °DIAGNOSIS °Your health care provider will perform a physical exam. During your exam, your health care provider may perform any of the following to help determine if you have acute sinusitis or chronic sinusitis: °· Look in your nose for signs of abnormal growths in your nostrils (nasal polyps). °· Tap over the affected sinus to check for signs of  infection. °· View the inside of your sinuses using an imaging device that has a light attached (endoscope). °If your health care provider suspects that you have chronic sinusitis, one or more of the following tests may be recommended: °· Allergy tests. °· Nasal culture. A sample of mucus is taken from your nose, sent to a lab, and screened for bacteria. °· Nasal cytology. A sample of mucus is taken from your nose and examined by your health care provider to determine if your sinusitis is related to an allergy. °TREATMENT °Most cases of acute sinusitis are related to a viral infection and will resolve on their own within 10 days. Sometimes, medicines are prescribed to help relieve symptoms of both acute and chronic sinusitis. These may include pain medicines, decongestants, nasal steroid sprays, or saline sprays. °However, for sinusitis related to a bacterial infection, your health care provider will prescribe antibiotic medicines. These are medicines that will help kill the bacteria causing the infection. °Rarely, sinusitis is caused by a fungal infection. In these cases, your health care provider will prescribe antifungal medicine. °For some cases of chronic sinusitis, surgery is needed. Generally, these are cases in which sinusitis recurs more than 3 times per year, despite other treatments. °HOME CARE INSTRUCTIONS °· Drink plenty of water. Water helps thin the mucus so your sinuses can drain more easily. °· Use a humidifier. °· Inhale steam 3-4 times a day (for example, sit in the bathroom with the shower running). °· Apply a warm, moist washcloth to your face 3-4 times a day, or as directed by your health care provider. °· Use saline nasal sprays to help   moisten and clean your sinuses.  Take medicines only as directed by your health care provider.  If you were prescribed either an antibiotic or antifungal medicine, finish it all even if you start to feel better. SEEK IMMEDIATE MEDICAL CARE IF:  You have  increasing pain or severe headaches.  You have nausea, vomiting, or drowsiness.  You have swelling around your face.  You have vision problems.  You have a stiff neck.  You have difficulty breathing.   This information is not intended to replace advice given to you by your health care provider. Make sure you discuss any questions you have with your health care provider.   Document Released: 10/23/2005 Document Revised: 11/13/2014 Document Reviewed: 11/07/2011  Take the Augmentin 1 pill twice a day with food. Sometimes it will give you diarrhea after taking without food. If he could bad diarrhea return here. Take the Vicodin 1-2 pills up to 4 times a day as needed for pain. Return for worse pain fever vomiting or feeling sicker. Dr. Elenore Rota suggested stopping the fluticasone because the alcohol if this I'll then can sometimes give you a headaches he says to switch to Nasacort which is over-the-counter. 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

## 2016-02-11 ENCOUNTER — Other Ambulatory Visit: Payer: Self-pay | Admitting: Otolaryngology

## 2016-02-11 DIAGNOSIS — H93A3 Pulsatile tinnitus, bilateral: Secondary | ICD-10-CM

## 2016-03-02 ENCOUNTER — Ambulatory Visit: Payer: 59

## 2016-03-03 ENCOUNTER — Ambulatory Visit
Admission: RE | Admit: 2016-03-03 | Discharge: 2016-03-03 | Disposition: A | Discharge status: Home / Self Care | Payer: 59 | Source: Ambulatory Visit | Attending: Otolaryngology | Admitting: Otolaryngology

## 2016-03-03 ENCOUNTER — Other Ambulatory Visit: Payer: Self-pay | Admitting: Otolaryngology

## 2016-03-03 DIAGNOSIS — H93A3 Pulsatile tinnitus, bilateral: Secondary | ICD-10-CM

## 2016-04-07 ENCOUNTER — Ambulatory Visit: Admission: RE | Admit: 2016-04-07 | Payer: 59 | Source: Ambulatory Visit

## 2016-04-07 ENCOUNTER — Ambulatory Visit
Admission: RE | Admit: 2016-04-07 | Discharge: 2016-04-07 | Disposition: A | Discharge status: Home / Self Care | Payer: 59 | Source: Ambulatory Visit | Attending: Otolaryngology | Admitting: Otolaryngology

## 2016-04-07 DIAGNOSIS — I7789 Other specified disorders of arteries and arterioles: Secondary | ICD-10-CM | POA: Insufficient documentation

## 2016-04-07 DIAGNOSIS — H93A3 Pulsatile tinnitus, bilateral: Secondary | ICD-10-CM | POA: Insufficient documentation

## 2016-04-07 MED ORDER — GADOBENATE DIMEGLUMINE 529 MG/ML IV SOLN
20.0000 mL | Freq: Once | INTRAVENOUS | Status: AC | PRN
  Administered 2016-04-07: 20 mL via INTRAVENOUS

## 2017-09-04 ENCOUNTER — Ambulatory Visit
Admission: EM | Admit: 2017-09-04 | Discharge: 2017-09-04 | Disposition: A | Discharge status: Home / Self Care | Payer: 59 | Attending: Family Medicine | Admitting: Family Medicine

## 2017-09-04 DIAGNOSIS — H8110 Benign paroxysmal vertigo, unspecified ear: Secondary | ICD-10-CM

## 2017-09-04 MED ORDER — PREDNISONE 20 MG PO TABS: 20.0000 mg | ORAL_TABLET | Freq: Every day | ORAL | 0 refills | Status: DC

## 2017-09-04 MED ORDER — MECLIZINE HCL 25 MG PO TABS: 25.0000 mg | ORAL_TABLET | Freq: Three times a day (TID) | ORAL | 0 refills | Status: DC | PRN

## 2017-09-04 NOTE — ED Provider Notes (Signed)
[facility]    CSN: [id number] Arrival date & time: 09/04/17  1013     History   Chief Complaint Chief Complaint  Patient presents with  . Gait Problem    HPI Marjean Donnangel Boyadjian Jr. is a 51 y.o. male.   51 yo male, generally healthy, presents with a c/o dizziness and unsteadiness on his feet. States he woke up with these symptoms today. States he's had the same symptoms once before about 4 years ago and had an extensive work-up which was negative and symptoms resolved on their own. Denies any vision changes, numbness/tingling, speech or swallowing problems, one-sided weakness, chest pains, palpitations, shortness of breath.    The history is provided by the patient.    Past Medical History:  Diagnosis Date  . Hypertension     There are no active problems to display for this patient.   Past Surgical History:  Procedure Laterality Date  . MANDIBLE SURGERY    . WRIST FRACTURE SURGERY         Home Medications    Prior to Admission medications   Medication Sig Start Date End Date Taking? Authorizing Provider  cyclobenzaprine (FLEXERIL) 10 MG tablet Take 1 tablet (10 mg total) by mouth at bedtime. prn 12/21/15   Payton Mccallumonty, Colan Laymon, MD  fexofenadine (ALLEGRA) 30 MG tablet Take 30 mg by mouth 2 (two) times daily.    [provider]  fluticasone (FLONASE) 50 MCG/ACT nasal spray Place into both nostrils daily.    [provider]  ibuprofen (ADVIL,MOTRIN) 800 MG tablet Take 1 tablet (800 mg total) by mouth 3 (three) times daily. 12/21/15   Payton Mccallumonty, Rydge Texidor, MD  meclizine (ANTIVERT) 25 MG tablet Take 1 tablet (25 mg total) by mouth 3 (three) times daily as needed for dizziness. 09/04/17   Payton Mccallumonty, Evolett Somarriba, MD  oxyCODONE-acetaminophen (ROXICET) 5-325 MG tablet Take 1 tablet by mouth every 6 (six) hours as needed for severe pain. 12/25/15   Arnaldo NatalMalinda, Paul F, MD  predniSONE (DELTASONE) 20 MG tablet Take 1 tablet (20 mg total) by mouth daily. 09/04/17   Payton Mccallumonty,  Cathi Hazan, MD  promethazine (PHENERGAN) 25 MG tablet Take 1 tablet (25 mg total) by mouth every 6 (six) hours as needed for nausea or vomiting. 12/22/15   Maurilio LovelyMcLaurin, Noelle, MD  triamterene-hydrochlorothiazide (DYAZIDE) 37.5-25 MG capsule Take 1 capsule by mouth daily.    [provider]    Family History Family History  Problem Relation Age of Onset  . Diabetes Mother     Social History Social History  Substance Use Topics  . Smoking status: Never Smoker  . Smokeless tobacco: Never Used  . Alcohol use No     Allergies   Corn-containing products and Shellfish allergy   Review of Systems Review of Systems   Physical Exam Triage Vital Signs ED Triage Vitals  Enc Vitals Group     BP 09/04/17 1037 115/73     Pulse Rate 09/04/17 1037 (!) 57     Resp 09/04/17 1037 16     Temp 09/04/17 1037 (!) 97.5 F (36.4 C)     Temp Source 09/04/17 1037 Oral     SpO2 09/04/17 1037 100 %     Weight 09/04/17 1038 215 lb (97.5 kg)     Height 09/04/17 1038 5' 10.5" (1.791 m)     Head Circumference --      Peak Flow --      Pain Score --      Pain Loc --  Pain Edu? --      Excl. in GC? --    No data found.   Updated Vital Signs BP 115/73 (BP Location: Right Arm)   Pulse (!) 57   Temp (!) 97.5 F (36.4 C) (Oral)   Resp 16   Ht 5' 10.5" (1.791 m)   Wt 215 lb (97.5 kg)   SpO2 100%   BMI 30.41 kg/m   Visual Acuity Right Eye Distance:   Left Eye Distance:   Bilateral Distance:    Right Eye Near:   Left Eye Near:    Bilateral Near:     Physical Exam  Constitutional: He is oriented to person, place, and time. He appears well-developed and well-nourished. No distress.  HENT:  Head: Normocephalic and atraumatic.  Right Ear: Tympanic membrane, external ear and ear canal normal.  Left Ear: Tympanic membrane, external ear and ear canal normal.  Nose: Nose normal.  Mouth/Throat: Uvula is midline, oropharynx is clear and moist and mucous membranes are normal. No  oropharyngeal exudate or tonsillar abscesses.  Eyes: Pupils are equal, round, and reactive to light. Conjunctivae and EOM are normal. Right eye exhibits no discharge. Left eye exhibits no discharge. No scleral icterus.  Neck: Normal range of motion. Neck supple. No tracheal deviation present. No thyromegaly present.  Cardiovascular: Normal rate, regular rhythm and normal heart sounds.   Pulmonary/Chest: Effort normal and breath sounds normal. No stridor. No respiratory distress. He has no wheezes. He has no rales. He exhibits no tenderness.  Lymphadenopathy:    He has no cervical adenopathy.  Neurological: He is alert and oriented to person, place, and time. He has normal reflexes. He displays normal reflexes. No cranial nerve deficit or sensory deficit. He exhibits normal muscle tone. Coordination normal. GCS eye subscore is 4. GCS verbal subscore is 5. GCS motor subscore is 6.  Strength normal and symmetric upper and lower extremities; Positive Hallpike maneuver (right)  Skin: Skin is warm and dry. No rash noted. He is not diaphoretic.  Nursing note and vitals reviewed.    UC Treatments / Results  Labs (all labs ordered are listed, but only abnormal results are displayed) Labs Reviewed - No data to display  EKG  EKG Interpretation None       Radiology No results found.  Procedures Procedures (including critical care time)  Medications Ordered in UC Medications - No data to display   Initial Impression / Assessment and Plan / UC Course  I have reviewed the triage vital signs and the nursing notes.  Pertinent labs & imaging results that were available during my care of the patient were reviewed by me and considered in my medical decision making (see chart for details).       Final Clinical Impressions(s) / UC Diagnoses   Final diagnoses:  Benign paroxysmal positional vertigo, unspecified laterality    New Prescriptions Discharge Medication List as of 09/04/2017  11:34 AM    START taking these medications   Details  meclizine (ANTIVERT) 25 MG tablet Take 1 tablet (25 mg total) by mouth 3 (three) times daily as needed for dizziness., Starting Tue 09/04/2017, Normal    predniSONE (DELTASONE) 20 MG tablet Take 1 tablet (20 mg total) by mouth daily., Starting Tue 09/04/2017, Normal      1. diagnosis reviewed with patient 2. rx as per orders above; reviewed possible side effects, interactions, risks and benefits  3. Recommend supportive treatment with vestibular exercises (modified Epley)   4. Follow-up prn if symptoms  worsen or don't improve  Controlled Substance Prescriptions [place] Controlled Substance Registry consulted? Not Applicable   [name], MD 09/04/17 1246

## 2017-09-04 NOTE — ED Triage Notes (Addendum)
Pt reports he was fine last night when he went to bed. Woke up for work and said he was unbalanced when trying to get to the bathroom. Had same several years ago and had work-up but was all negative and went away. Pt with equal grips, PERRL, no facial asymmetry, no palmar drift. No numbness, no tingling, no weakness. No pain. Denies dizziness or lightheadedness. Pt reports he had his flu shot yesterday afternoon

## 2018-02-17 ENCOUNTER — Other Ambulatory Visit: Payer: Self-pay

## 2018-02-17 ENCOUNTER — Ambulatory Visit
Admission: EM | Admit: 2018-02-17 | Discharge: 2018-02-17 | Disposition: A | Discharge status: Home / Self Care | Payer: 59 | Attending: Family Medicine | Admitting: Family Medicine

## 2018-02-17 DIAGNOSIS — M545 Low back pain, unspecified: Secondary | ICD-10-CM

## 2018-02-17 HISTORY — DX: Dizziness and giddiness: R42

## 2018-02-17 MED ORDER — KETOROLAC TROMETHAMINE 60 MG/2ML IM SOLN
60.0000 mg | Freq: Once | INTRAMUSCULAR | Status: AC
  Administered 2018-02-17: 60 mg via INTRAMUSCULAR

## 2018-02-17 MED ORDER — PREDNISONE 20 MG PO TABS: ORAL_TABLET | ORAL | 0 refills | Status: DC

## 2018-02-17 NOTE — ED Triage Notes (Signed)
Pt with low back pain x 2 weeks all the way across low back. Not currently radiating down legs. Has had previous back problems. Reports he stood up from a chair and "it almost took me to my knees" 2 weeks ago.

## 2018-02-17 NOTE — ED Provider Notes (Signed)
[facility]    CSN: [id number] Arrival date & time: 02/17/18  1133     History   Chief Complaint Chief Complaint  Patient presents with  . Back Pain    HPI Justin Heard. is a 52 y.o. male.   The history is provided by the patient.  Back Pain  Location:  Lumbar spine Quality:  Aching Radiates to:  Does not radiate Pain severity:  Severe Pain is:  Same all the time Onset quality:  Gradual Timing:  Constant Progression:  Worsening Chronicity:  Chronic Context: not emotional stress, not falling, not jumping from heights, not lifting heavy objects, not MCA, not MVA, not occupational injury, not pedestrian accident, not physical stress, not recent illness, not recent injury and not twisting   Relieved by:  Nothing Ineffective treatments:  NSAIDs and OTC medications Associated symptoms: no abdominal pain, no abdominal swelling, no bladder incontinence, no bowel incontinence, no chest pain, no dysuria, no fever, no headaches, no leg pain, no numbness, no paresthesias, no pelvic pain, no perianal numbness, no tingling, no weakness and no weight loss   Risk factors: no hx of cancer, no hx of osteoporosis, no recent surgery and no steroid use     Past Medical History:  Diagnosis Date  . Hypertension   . Vertigo     There are no active problems to display for this patient.   Past Surgical History:  Procedure Laterality Date  . MANDIBLE SURGERY    . SHOULDER ARTHROSCOPY    . WRIST FRACTURE SURGERY         Home Medications    Prior to Admission medications   Medication Sig Start Date End Date Taking? Authorizing Provider  cyclobenzaprine (FLEXERIL) 10 MG tablet Take 1 tablet (10 mg total) by mouth at bedtime. prn 12/21/15   [name], MD  fexofenadine (ALLEGRA) 30 MG tablet Take 30 mg by mouth 2 (two) times daily.    [provider]  fluticasone (FLONASE) 50 MCG/ACT nasal spray Place into both nostrils daily.    [provider]    ibuprofen (ADVIL,MOTRIN) 800 MG tablet Take 1 tablet (800 mg total) by mouth 3 (three) times daily. 12/21/15   [name], MD  meclizine (ANTIVERT) 25 MG tablet Take 1 tablet (25 mg total) by mouth 3 (three) times daily as needed for dizziness. 09/04/17   [name], MD  oxyCODONE-acetaminophen (ROXICET) 5-325 MG tablet Take 1 tablet by mouth every 6 (six) hours as needed for severe pain. 12/25/15   Arnaldo Natal, MD  predniSONE (DELTASONE) 20 MG tablet 3 tabs po qd x 2 days, then 2 tabs po qd x 3 days, then 1 tab po qd x 2 days, then half a tab po qd x 2 days 02/17/18   [name], MD  promethazine (PHENERGAN) 25 MG tablet Take 1 tablet (25 mg total) by mouth every 6 (six) hours as needed for nausea or vomiting. 12/22/15   Maurilio Lovely, MD  triamterene-hydrochlorothiazide (DYAZIDE) 37.5-25 MG capsule Take 1 capsule by mouth daily.    [provider]    Family History Family History  Problem Relation Age of Onset  . Diabetes Mother     Social History Social History   Tobacco Use  . Smoking status: Never Smoker  . Smokeless tobacco: Never Used  Substance Use Topics  . Alcohol use: No  . Drug use: No     Allergies   Corn-containing products and Shellfish allergy   Review of Systems  Review of Systems  Constitutional: Negative for fever and weight loss.  Cardiovascular: Negative for chest pain.  Gastrointestinal: Negative for abdominal pain and bowel incontinence.  Genitourinary: Negative for bladder incontinence, dysuria and pelvic pain.  Musculoskeletal: Positive for back pain.  Neurological: Negative for tingling, weakness, numbness, headaches and paresthesias.     Physical Exam Triage Vital Signs ED Triage Vitals  Enc Vitals Group     BP 02/17/18 1142 120/75     Pulse Rate 02/17/18 1142 70     Resp 02/17/18 1142 18     Temp 02/17/18 1142 98 F (36.7 C)     Temp Source 02/17/18 1142 Oral     SpO2 02/17/18 1142 98 %     Weight 02/17/18 1143  215 lb (97.5 kg)     Height 02/17/18 1143 5\' 11"  (1.803 m)     Head Circumference --      Peak Flow --      Pain Score 02/17/18 1142 10     Pain Loc --      Pain Edu? --      Excl. in GC? --    No data found.  Updated Vital Signs BP 120/75 (BP Location: Right Arm)   Pulse 70   Temp 98 F (36.7 C) (Oral)   Resp 18   Ht 5\' 11"  (1.803 m)   Wt 215 lb (97.5 kg)   SpO2 98%   BMI 29.99 kg/m   Visual Acuity Right Eye Distance:   Left Eye Distance:   Bilateral Distance:    Right Eye Near:   Left Eye Near:    Bilateral Near:     Physical Exam  Constitutional: He appears well-developed and well-nourished. No distress.  Neck: Normal range of motion. Neck supple. No tracheal deviation present.  Pulmonary/Chest: Effort normal. No stridor. No respiratory distress.  Musculoskeletal:       Lumbar back: He exhibits tenderness (over the lumbar paraspinous muscles) and spasm. He exhibits normal range of motion, no bony tenderness, no swelling, no edema, no deformity, no laceration, no pain and normal pulse.  Neurological: He is alert. He has normal reflexes. He displays normal reflexes. He exhibits normal muscle tone. Coordination normal.  Skin: No rash noted. He is not diaphoretic.  Nursing note and vitals reviewed.    UC Treatments / Results  Labs (all labs ordered are listed, but only abnormal results are displayed) Labs Reviewed - No data to display  EKG None Radiology No results found.  Procedures Procedures (including critical care time)  Medications Ordered in UC Medications  ketorolac (TORADOL) injection 60 mg (60 mg Intramuscular Given 02/17/18 1206)     Initial Impression / Assessment and Plan / UC Course  I have reviewed the triage vital signs and the nursing notes.  Pertinent labs & imaging results that were available during my care of the patient were reviewed by me and considered in my medical decision making (see chart for details).       Final  Clinical Impressions(s) / UC Diagnoses   Final diagnoses:  Bilateral low back pain without sciatica, unspecified chronicity    ED Discharge Orders        Ordered    predniSONE (DELTASONE) 20 MG tablet     02/17/18 1202     1. diagnosis reviewed with patient; given toradol 60mg  IM x1 2. rx as per orders above; reviewed possible side effects, interactions, risks and benefits  3. Recommend supportive treatment with heat to area; stretch  4. Follow-up prn if symptoms worsen or don't improve   Controlled Substance Prescriptions [place] Controlled Substance Registry consulted? Not Applicable   [name], MD 02/17/18 1304

## 2018-04-11 ENCOUNTER — Ambulatory Visit
Admission: EM | Admit: 2018-04-11 | Discharge: 2018-04-11 | Disposition: A | Discharge status: Home / Self Care | Payer: 59 | Attending: Family Medicine | Admitting: Family Medicine

## 2018-04-11 ENCOUNTER — Other Ambulatory Visit: Payer: Self-pay

## 2018-04-11 ENCOUNTER — Encounter: Payer: Self-pay | Admitting: Emergency Medicine

## 2018-04-11 DIAGNOSIS — B9789 Other viral agents as the cause of diseases classified elsewhere: Secondary | ICD-10-CM | POA: Diagnosis not present

## 2018-04-11 DIAGNOSIS — R05 Cough: Secondary | ICD-10-CM

## 2018-04-11 DIAGNOSIS — R062 Wheezing: Secondary | ICD-10-CM | POA: Diagnosis not present

## 2018-04-11 DIAGNOSIS — J069 Acute upper respiratory infection, unspecified: Secondary | ICD-10-CM

## 2018-04-11 MED ORDER — ALBUTEROL SULFATE HFA 108 (90 BASE) MCG/ACT IN AERS: 2.0000 | INHALATION_SPRAY | Freq: Four times a day (QID) | RESPIRATORY_TRACT | 0 refills | Status: DC | PRN

## 2018-04-11 MED ORDER — PREDNISONE 20 MG PO TABS: 40.0000 mg | ORAL_TABLET | Freq: Every day | ORAL | 0 refills | Status: AC

## 2018-04-11 MED ORDER — HYDROCOD POLST-CPM POLST ER 10-8 MG/5ML PO SUER: 5.0000 mL | Freq: Every evening | ORAL | 0 refills | Status: DC | PRN

## 2018-04-11 NOTE — Discharge Instructions (Signed)
Recommend start Prednisone 40mg  daily for 5 days. Use Albuterol inhaler 2 puffs every 6 hours as needed for cough and wheezing. May use Tussionex cough syrup 1 teaspoon at bedtime as needed for cough. Recommend follow-up with your PCP in 4 to 5 days if not improving.

## 2018-04-11 NOTE — ED Triage Notes (Signed)
Patient c/o cough and chest congestion for a week.  Patient denies fevers.  °

## 2018-04-11 NOTE — ED Provider Notes (Signed)
[facility]    CSN: [id number] Arrival date & time: 04/11/18  1612     History   Chief Complaint Chief Complaint  Patient presents with  . Cough    HPI Justin Donnangel Liaw Jr. is a 52 y.o. male.   52 year old male presents with slight nasal congestion, cough and chest congestion for about 1 week. Denies any fever or GI symptoms. Having difficulty sleeping at night due to cough. Has tried Robitussin and Delsym with minimal relief. No other family members ill. Has history of HTN and seasonal allergies and currently on Dyazide, Flonase and Allegra daily. Does not smoke.   The history is provided by the patient.    Past Medical History:  Diagnosis Date  . Hypertension   . Vertigo     There are no active problems to display for this patient.   Past Surgical History:  Procedure Laterality Date  . MANDIBLE SURGERY    . SHOULDER ARTHROSCOPY    . WRIST FRACTURE SURGERY         Home Medications    Prior to Admission medications   Medication Sig Start Date End Date Taking? Authorizing Provider  fexofenadine (ALLEGRA) 30 MG tablet Take 30 mg by mouth 2 (two) times daily.   Yes [provider]  fluticasone (FLONASE) 50 MCG/ACT nasal spray Place into both nostrils daily.   Yes [provider]  triamterene-hydrochlorothiazide (DYAZIDE) 37.5-25 MG capsule Take 1 capsule by mouth daily.   Yes [provider]  albuterol (PROVENTIL HFA;VENTOLIN HFA) 108 (90 Base) MCG/ACT inhaler Inhale 2 puffs into the lungs every 6 (six) hours as needed for wheezing. 04/11/18   [name], Jaeven Wanzer Berry, NP  chlorpheniramine-HYDROcodone (TUSSIONEX PENNKINETIC ER) 10-8 MG/5ML SUER Take 5 mLs by mouth at bedtime as needed for cough. 04/11/18   [name], Spence Soberano Berry, NP  ibuprofen (ADVIL,MOTRIN) 800 MG tablet Take 1 tablet (800 mg total) by mouth 3 (three) times daily. 12/21/15   Payton Mccallumonty, [place], MD  predniSONE (DELTASONE) 20 MG tablet Take 2 tablets (40 mg total) by mouth daily for 5  days. 04/11/18 04/16/18  [name], Macee Venables Berry, NP    Family History Family History  Problem Relation Age of Onset  . Diabetes Mother     Social History Social History   Tobacco Use  . Smoking status: Never Smoker  . Smokeless tobacco: Never Used  Substance Use Topics  . Alcohol use: No  . Drug use: No     Allergies   Corn-containing products and Shellfish allergy   Review of Systems Review of Systems  Constitutional: Positive for fatigue. Negative for activity change, appetite change, chills and fever.  HENT: Positive for congestion and postnasal drip. Negative for ear discharge, ear pain, facial swelling, mouth sores, nosebleeds, rhinorrhea, sinus pressure, sinus pain, sneezing, sore throat and trouble swallowing.   Eyes: Negative for photophobia, pain, discharge, redness, itching and visual disturbance.  Respiratory: Positive for cough and wheezing. Negative for chest tightness and shortness of breath.   Cardiovascular: Negative for chest pain and palpitations.  Gastrointestinal: Negative for abdominal pain, diarrhea, nausea and vomiting.  Genitourinary: Negative for decreased urine volume, difficulty urinating and flank pain.  Musculoskeletal: Negative for arthralgias, back pain, myalgias, neck pain and neck stiffness.  Skin: Negative for rash and wound.  Allergic/Immunologic: Positive for environmental allergies.  Neurological: Negative for dizziness, tremors, seizures, syncope, weakness, light-headedness, numbness and headaches.  Hematological: Negative for adenopathy. Does not bruise/bleed easily.     Physical Exam Triage Vital  Signs ED Triage Vitals  Enc Vitals Group     BP 04/11/18 1650 121/81     Pulse Rate 04/11/18 1650 62     Resp 04/11/18 1650 16     Temp 04/11/18 1650 98.4 F (36.9 C)     Temp Source 04/11/18 1650 Oral     SpO2 04/11/18 1650 97 %     Weight 04/11/18 1647 220 lb (99.8 kg)     Height 04/11/18 1647 5\' 11"  (1.803 m)     Head Circumference --        Peak Flow --      Pain Score 04/11/18 1647 0     Pain Loc --      Pain Edu? --      Excl. in GC? --    No data found.  Updated Vital Signs BP 121/81 (BP Location: Left Arm)   Pulse 62   Temp 98.4 F (36.9 C) (Oral)   Resp 16   Ht 5\' 11"  (1.803 m)   Wt 220 lb (99.8 kg)   SpO2 97%   BMI 30.68 kg/m   Visual Acuity Right Eye Distance:   Left Eye Distance:   Bilateral Distance:    Right Eye Near:   Left Eye Near:    Bilateral Near:     Physical Exam  Constitutional: He is oriented to person, place, and time. Vital signs are normal. He appears well-developed and well-nourished. He is cooperative. He appears ill. No distress.  Patient is sitting on exam table in no acute distress.   HENT:  Head: Normocephalic and atraumatic.  Right Ear: Hearing, tympanic membrane, external ear and ear canal normal.  Left Ear: Hearing, tympanic membrane, external ear and ear canal normal.  Nose: Nose normal. Right sinus exhibits no maxillary sinus tenderness and no frontal sinus tenderness. Left sinus exhibits no maxillary sinus tenderness and no frontal sinus tenderness.  Mouth/Throat: Uvula is midline and mucous membranes are normal. Posterior oropharyngeal erythema present. No oropharyngeal exudate.  Eyes: Conjunctivae and EOM are normal.  Neck: Normal range of motion. Neck supple.  Cardiovascular: Normal rate, regular rhythm and normal heart sounds.  No murmur heard. Pulmonary/Chest: Effort normal. No respiratory distress. He has no decreased breath sounds. He has wheezes in the right upper field and the left upper field. He has no rhonchi. He has no rales.  Musculoskeletal: Normal range of motion.  Lymphadenopathy:    He has no cervical adenopathy.  Neurological: He is alert and oriented to person, place, and time.  Skin: Skin is warm and dry. Capillary refill takes less than 2 seconds. No rash noted.  Psychiatric: He has a normal mood and affect. His behavior is normal. Judgment and  thought content normal.  Vitals reviewed.    UC Treatments / Results  Labs (all labs ordered are listed, but only abnormal results are displayed) Labs Reviewed - No data to display  EKG None  Radiology No results found.  Procedures Procedures (including critical care time)  Medications Ordered in UC Medications - No data to display  Initial Impression / Assessment and Plan / UC Course  I have reviewed the triage vital signs and the nursing notes.  Pertinent labs & imaging results that were available during my care of the patient were reviewed by me and considered in my medical decision making (see chart for details).     Reviewed with patient that he probably has a viral upper respiratory infection and cough. Recommend start Albuterol inhaler 2  puffs every 6 hours as needed for cough and wheezing. Start Prednisone 40mg  daily for 5 days. May take Tussionex 1 teaspoon at night to help with cough and sleep. Increase fluid intake to help loosen up mucus. Recommend follow-up with his PCP in 4 to 5 days if not improving.   Final Clinical Impressions(s) / UC Diagnoses   Final diagnoses:  Viral URI with cough  Wheezing     Discharge Instructions     Recommend start Prednisone 40mg  daily for 5 days. Use Albuterol inhaler 2 puffs every 6 hours as needed for cough and wheezing. May use Tussionex cough syrup 1 teaspoon at bedtime as needed for cough. Recommend follow-up with your PCP in 4 to 5 days if not improving.     ED Prescriptions    Medication Sig Dispense Auth. Provider   predniSONE (DELTASONE) 20 MG tablet Take 2 tablets (40 mg total) by mouth daily for 5 days. 10 tablet Sudie Grumbling, NP   albuterol (PROVENTIL HFA;VENTOLIN HFA) 108 (90 Base) MCG/ACT inhaler Inhale 2 puffs into the lungs every 6 (six) hours as needed for wheezing. 1 Inhaler Tekla Malachowski, Ali Lowe, NP   chlorpheniramine-HYDROcodone (TUSSIONEX PENNKINETIC ER) 10-8 MG/5ML SUER Take 5 mLs by mouth at bedtime as  needed for cough. 60 mL Sudie Grumbling, NP     Controlled Substance Prescriptions [place] Controlled Substance Registry consulted? Yes, I have consulted the [place] Controlled Substances Registry for this patient, and feel the risk/benefit ratio today is favorable for proceeding with this prescription for a controlled substance. Last active Rx was in Feb 2018 for oxycodone. No other active Rx currently.    Sudie Grumbling, NP 04/11/18 2011

## 2018-06-15 ENCOUNTER — Ambulatory Visit
Admission: EM | Admit: 2018-06-15 | Discharge: 2018-06-15 | Disposition: A | Discharge status: Home / Self Care | Payer: 59 | Attending: Family Medicine | Admitting: Family Medicine

## 2018-06-15 DIAGNOSIS — M791 Myalgia, unspecified site: Secondary | ICD-10-CM

## 2018-06-15 DIAGNOSIS — B349 Viral infection, unspecified: Secondary | ICD-10-CM

## 2018-06-15 DIAGNOSIS — R509 Fever, unspecified: Secondary | ICD-10-CM

## 2018-06-15 DIAGNOSIS — R197 Diarrhea, unspecified: Secondary | ICD-10-CM

## 2018-06-15 DIAGNOSIS — R51 Headache: Secondary | ICD-10-CM | POA: Diagnosis not present

## 2018-06-15 MED ORDER — KETOROLAC TROMETHAMINE 60 MG/2ML IM SOLN
60.0000 mg | Freq: Once | INTRAMUSCULAR | Status: AC
  Administered 2018-06-15: 60 mg via INTRAMUSCULAR

## 2018-06-15 MED ORDER — ONDANSETRON 8 MG PO TBDP: 8.0000 mg | ORAL_TABLET | Freq: Three times a day (TID) | ORAL | 0 refills | Status: DC | PRN

## 2018-06-15 NOTE — ED Triage Notes (Signed)
Pt states after eating peanut butter ice cream last night has been feeling bad. Bloating, body aches, headache, fever and generalized pain. Just feels bad. Did take alka seltzer and no appetite and upset stomach.

## 2018-06-15 NOTE — ED Notes (Signed)
Pt feels much better after lying down for a few minutes and having sips of ginger ale.

## 2018-06-15 NOTE — ED Notes (Signed)
After injection pt becomes slightly dizzy. Pt instructed to lie down on stretcher. VS obtained. VSS. Ginger Ale given.

## 2018-06-16 NOTE — ED Provider Notes (Signed)
[facility]    CSN: [id number] Arrival date & time: 06/15/18  1252     History   Chief Complaint Chief Complaint  Patient presents with  . Fever    HPI Justin Woodward. is a 52 y.o. male.   52 yo male with a c/o bodyaches, headache, fever, abdominal bloating and diarrhea since last night. State had 3 episodes of watery diarrhea. Denies any known sick contacts, recent travel, melena, hematochezia, mucous in stool. However states he ate out yesterday late afternoon and symptoms started 3-4 hours later.    Fever    Past Medical History:  Diagnosis Date  . Hypertension   . Vertigo     There are no active problems to display for this patient.   Past Surgical History:  Procedure Laterality Date  . MANDIBLE SURGERY    . SHOULDER ARTHROSCOPY    . WRIST FRACTURE SURGERY         Home Medications    Prior to Admission medications   Medication Sig Start Date End Date Taking? Authorizing Provider  fexofenadine (ALLEGRA) 30 MG tablet Take 30 mg by mouth 2 (two) times daily.   Yes [provider]  triamterene-hydrochlorothiazide (DYAZIDE) 37.5-25 MG capsule Take 1 capsule by mouth daily.   Yes [provider]  albuterol (PROVENTIL HFA;VENTOLIN HFA) 108 (90 Base) MCG/ACT inhaler Inhale 2 puffs into the lungs every 6 (six) hours as needed for wheezing. 04/11/18   [name], [name], NP  chlorpheniramine-HYDROcodone (TUSSIONEX PENNKINETIC ER) 10-8 MG/5ML SUER Take 5 mLs by mouth at bedtime as needed for cough. 04/11/18   [name], [name], NP  fluticasone (FLONASE) 50 MCG/ACT nasal spray Place into both nostrils daily.    [provider]  ibuprofen (ADVIL,MOTRIN) 800 MG tablet Take 1 tablet (800 mg total) by mouth 3 (three) times daily. 12/21/15   Payton Mccallumonty, Ripken Rekowski, MD  ondansetron (ZOFRAN ODT) 8 MG disintegrating tablet Take 1 tablet (8 mg total) by mouth every 8 (eight) hours as needed. 06/15/18   Payton Mccallumonty, Rohin Krejci, MD    Family History Family  History  Problem Relation Age of Onset  . Diabetes Mother     Social History Social History   Tobacco Use  . Smoking status: Never Smoker  . Smokeless tobacco: Never Used  Substance Use Topics  . Alcohol use: No  . Drug use: No     Allergies   Corn-containing products and Shellfish allergy   Review of Systems Review of Systems  Constitutional: Positive for fever.     Physical Exam Triage Vital Signs ED Triage Vitals  Enc Vitals Group     BP 06/15/18 1259 112/74     Pulse Rate 06/15/18 1259 71     Resp 06/15/18 1259 18     Temp 06/15/18 1259 (!) 100.7 F (38.2 C)     Temp Source 06/15/18 1259 Oral     SpO2 06/15/18 1259 97 %     Weight --      Height --      Head Circumference --      Peak Flow --      Pain Score 06/15/18 1302 8     Pain Loc --      Pain Edu? --      Excl. in GC? --    No data found.  Updated Vital Signs BP 112/69 (BP Location: Left Arm)   Pulse 60   Temp (!) 100.7 F (38.2 C) (Oral)   Resp 18  SpO2 97%   Visual Acuity Right Eye Distance:   Left Eye Distance:   Bilateral Distance:    Right Eye Near:   Left Eye Near:    Bilateral Near:     Physical Exam  Constitutional: He is oriented to person, place, and time. He appears well-developed and well-nourished. No distress.  HENT:  Head: Normocephalic and atraumatic.  Eyes: Pupils are equal, round, and reactive to light. EOM are normal.  Cardiovascular: Normal rate, regular rhythm, normal heart sounds and intact distal pulses.  No murmur heard. Pulmonary/Chest: Effort normal and breath sounds normal. No respiratory distress. He has no wheezes. He has no rales.  Abdominal: Soft. Bowel sounds are normal. He exhibits no distension and no mass. There is tenderness (mild, diffuse). There is no rebound and no guarding.  Neurological: He is alert and oriented to person, place, and time.  Skin: No rash noted. He is not diaphoretic.  Nursing note and vitals reviewed.    UC Treatments  / Results  Labs (all labs ordered are listed, but only abnormal results are displayed) Labs Reviewed - No data to display  EKG None  Radiology No results found.  Procedures Procedures (including critical care time)  Medications Ordered in UC Medications  ketorolac (TORADOL) injection 60 mg (60 mg Intramuscular Given 06/15/18 1406)    Initial Impression / Assessment and Plan / UC Course  I have reviewed the triage vital signs and the nursing notes.  Pertinent labs & imaging results that were available during my care of the patient were reviewed by me and considered in my medical decision making (see chart for details).      Final Clinical Impressions(s) / UC Diagnoses   Final diagnoses:  Viral syndrome   Discharge Instructions   None    ED Prescriptions    Medication Sig Dispense Auth. Provider   ondansetron (ZOFRAN ODT) 8 MG disintegrating tablet Take 1 tablet (8 mg total) by mouth every 8 (eight) hours as needed. 6 tablet [name], MD     1. diagnosis reviewed with patient 2. Given toradol 60mg  IM x 1 for headache 3. rx as per orders above; reviewed possible side effects, interactions, risks and benefits  3. Recommend supportive treatment with clear liquids/increased fluids, then advance slowly as tolerated 4. Follow-up prn if symptoms worsen or don't improve   Controlled Substance Prescriptions [place] Controlled Substance Registry consulted? Not Applicable   [name], MD 06/16/18 1011

## 2018-06-17 ENCOUNTER — Ambulatory Visit
Admission: EM | Admit: 2018-06-17 | Discharge: 2018-06-17 | Disposition: A | Discharge status: Home / Self Care | Payer: 59 | Attending: Family Medicine | Admitting: Family Medicine

## 2018-06-17 ENCOUNTER — Other Ambulatory Visit: Payer: Self-pay

## 2018-06-17 DIAGNOSIS — S39012A Strain of muscle, fascia and tendon of lower back, initial encounter: Secondary | ICD-10-CM

## 2018-06-17 MED ORDER — METHYLPREDNISOLONE 4 MG PO TBPK: ORAL_TABLET | ORAL | 0 refills | Status: DC

## 2018-06-17 MED ORDER — KETOROLAC TROMETHAMINE 60 MG/2ML IM SOLN
60.0000 mg | Freq: Once | INTRAMUSCULAR | Status: AC
  Administered 2018-06-17: 60 mg via INTRAMUSCULAR

## 2018-06-17 MED ORDER — MELOXICAM 15 MG PO TABS: 15.0000 mg | ORAL_TABLET | Freq: Every day | ORAL | 0 refills | Status: DC

## 2018-06-17 MED ORDER — METAXALONE 800 MG PO TABS: 800.0000 mg | ORAL_TABLET | Freq: Three times a day (TID) | ORAL | 0 refills | Status: DC

## 2018-06-17 NOTE — ED Triage Notes (Signed)
Patient complains of lower back pain that started on Thursday and worsened today. States that he has been seen for this before.

## 2018-06-17 NOTE — Discharge Instructions (Addendum)
Apply ice 20 minutes out of every 2 hours 4-5 times daily for comfort. Use  Caution while taking muscle relaxers.  Do not perform activities requiring concentration or judgment and do not drive.  °

## 2018-06-17 NOTE — ED Provider Notes (Addendum)
[facility]    CSN: [id number] Arrival date & time: 06/17/18  1729     History   Chief Complaint Chief Complaint  Patient presents with  . Back Pain    HPI Justin Donnangel Fridman Jr. is a 52 y.o. male.   HPI  52 year old male known to our clinic from previous encounters returns today stating he had pain in his lower back suddenly on Thursday 4 days prior to this visit when he was arising from a sitting position.  He states it is mostly on the left side and will radiate into his testicles at times.  States today it worsened he was on the toilet to have assistance from his coworkers to extract him from the restroom.  Last seen for this same condition on 02/17/2018 was given a shot of Toradol placed on prednisone and muscle relaxers and he did very well until most recently. Denies Any incontinence.  States any change in position seems to exacerbate it.  Had no urinary symptoms.         Past Medical History:  Diagnosis Date  . Hypertension   . Vertigo     There are no active problems to display for this patient.   Past Surgical History:  Procedure Laterality Date  . MANDIBLE SURGERY    . SHOULDER ARTHROSCOPY    . WRIST FRACTURE SURGERY         Home Medications    Prior to Admission medications   Medication Sig Start Date End Date Taking? Authorizing Provider  fexofenadine (ALLEGRA) 30 MG tablet Take 30 mg by mouth 2 (two) times daily.   Yes [provider]  fluticasone (FLONASE) 50 MCG/ACT nasal spray Place into both nostrils daily.   Yes [provider]  ondansetron (ZOFRAN ODT) 8 MG disintegrating tablet Take 1 tablet (8 mg total) by mouth every 8 (eight) hours as needed. 06/15/18  Yes Payton Mccallumonty, [place], MD  triamterene-hydrochlorothiazide (DYAZIDE) 37.5-25 MG capsule Take 1 capsule by mouth daily.   Yes [provider]  meloxicam (MOBIC) 15 MG tablet Take 1 tablet (15 mg total) by mouth daily. Start taking after finishing prednisone  taper 06/17/18   Lutricia Feiloemer, Gad Aymond P, PA-C  metaxalone (SKELAXIN) 800 MG tablet Take 1 tablet (800 mg total) by mouth 3 (three) times daily. 06/17/18   Lutricia Feiloemer, Kimberley Dastrup P, PA-C  methylPREDNISolone (MEDROL DOSEPAK) 4 MG TBPK tablet Take per package instructions 06/17/18   Lutricia Feiloemer, Octavie Westerhold P, PA-C    Family History Family History  Problem Relation Age of Onset  . Diabetes Mother     Social History Social History   Tobacco Use  . Smoking status: Never Smoker  . Smokeless tobacco: Never Used  Substance Use Topics  . Alcohol use: No  . Drug use: No     Allergies   Corn-containing products and Shellfish allergy   Review of Systems Review of Systems  Constitutional: Positive for activity change. Negative for appetite change, chills, fatigue and fever.  Musculoskeletal: Positive for back pain.  All other systems reviewed and are negative.    Physical Exam Triage Vital Signs ED Triage Vitals  Enc Vitals Group     BP 06/17/18 1743 114/82     Pulse Rate 06/17/18 1743 65     Resp 06/17/18 1743 17     Temp 06/17/18 1743 98.2 F (36.8 C)     Temp Source 06/17/18 1743 Oral     SpO2 06/17/18 1743 97 %     Weight 06/17/18 1742 215  lb (97.5 kg)     Height 06/17/18 1742 5\' 11"  (1.803 m)     Head Circumference --      Peak Flow --      Pain Score 06/17/18 1742 10     Pain Loc --      Pain Edu? --      Excl. in GC? --    No data found.  Updated Vital Signs BP 114/82 (BP Location: Left Arm)   Pulse 65   Temp 98.2 F (36.8 C) (Oral)   Resp 17   Ht 5\' 11"  (1.803 m)   Wt 215 lb (97.5 kg)   SpO2 97%   BMI 29.99 kg/m   Visual Acuity Right Eye Distance:   Left Eye Distance:   Bilateral Distance:    Right Eye Near:   Left Eye Near:    Bilateral Near:     Physical Exam  Constitutional: He is oriented to person, place, and time. He appears well-developed and well-nourished. No distress.  HENT:  Head: Normocephalic.  Eyes: Pupils are equal, round, and reactive to light.  Right eye exhibits no discharge. Left eye exhibits no discharge.  Neck: Normal range of motion.  Musculoskeletal: He exhibits tenderness.  Neurological: He is alert and oriented to person, place, and time.  Skin: Skin is warm and dry. He is not diaphoretic.  Psychiatric: He has a normal mood and affect. His behavior is normal. Judgment and thought content normal.  Nursing note and vitals reviewed.    UC Treatments / Results  Labs (all labs ordered are listed, but only abnormal results are displayed) Labs Reviewed - No data to display  EKG None  Radiology No results found.  Procedures Procedures (including critical care time)  Medications Ordered in UC Medications  ketorolac (TORADOL) injection 60 mg (60 mg Intramuscular Given 06/17/18 1827)    Initial Impression / Assessment and Plan / UC Course  I have reviewed the triage vital signs and the nursing notes.  Pertinent labs & imaging results that were available during my care of the patient were reviewed by me and considered in my medical decision making (see chart for details).     Plan: 1. Test/x-ray results and diagnosis reviewed with patient 2. rx as per orders; risks, benefits, potential side effects reviewed with patient 3. Recommend supportive treatment with Apply ice 20 minutes out of every 2 hours 4-5 times daily for comfort.Use  Caution while taking muscle relaxers,  Do not perform activities requiring concentration or judgment and do not drive.  Start taking prednisone for anti-inflammatory and then switch over to Mobic after you have completed the prednisone.  Use muscle relaxers when not performing activities requiring concentration or judgment or driving.  If you are not improving recommend following up with your primary care physician. 4. F/u prn if symptoms worsen or don't improve  Final Clinical Impressions(s) / UC Diagnoses   Final diagnoses:  Acute myofascial strain of lumbar region, initial encounter      Discharge Instructions     Apply ice 20 minutes out of every 2 hours 4-5 times daily for comfort. Use  Caution while taking muscle relaxers.  Do not perform activities requiring concentration or judgment and do not drive.     ED Prescriptions    Medication Sig Dispense Auth. Provider   metaxalone (SKELAXIN) 800 MG tablet Take 1 tablet (800 mg total) by mouth 3 (three) times daily. 21 tablet Ovid Curd P, PA-C   methylPREDNISolone (MEDROL DOSEPAK) 4  MG TBPK tablet Take per package instructions 21 tablet Lutricia FeilRoemer, Simrin Vegh P, PA-C   meloxicam (MOBIC) 15 MG tablet Take 1 tablet (15 mg total) by mouth daily. Start taking after finishing prednisone taper 30 tablet Lutricia Feiloemer, Salote Weidmann P, PA-C     Controlled Substance Prescriptions [place] Controlled Substance Registry consulted? Not Applicable   Lutricia FeilRoemer, Laruen Risser P, PA-C 06/17/18 1848    Lutricia Feiloemer, Mingo Siegert P, PA-C 06/17/18 1849

## 2019-02-16 ENCOUNTER — Ambulatory Visit
Admission: EM | Admit: 2019-02-16 | Discharge: 2019-02-16 | Disposition: A | Discharge status: Home / Self Care | Payer: 59 | Attending: Family Medicine | Admitting: Family Medicine

## 2019-02-16 DIAGNOSIS — L245 Irritant contact dermatitis due to other chemical products: Secondary | ICD-10-CM

## 2019-02-16 MED ORDER — HYDROXYZINE HCL 25 MG PO TABS: 25.0000 mg | ORAL_TABLET | Freq: Three times a day (TID) | ORAL | 0 refills | Status: DC | PRN

## 2019-02-16 MED ORDER — PREDNISONE 10 MG PO TABS: ORAL_TABLET | ORAL | 0 refills | Status: DC

## 2019-02-16 NOTE — ED Triage Notes (Signed)
Pt here for rash on his left torso. 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 Did try benadryl and otc cream but no relief. Is very itchy.

## 2019-02-16 NOTE — Discharge Instructions (Addendum)
Take medication as prescribed. Monitor. Avoid scratching.  ° °Follow up with your primary care physician this week as needed. Return to Urgent care for new or worsening concerns.  ° °

## 2019-02-16 NOTE — ED Provider Notes (Signed)
[facility] ____________________________________________  Time seen: Approximately 12:28 PM  I have reviewed the triage vital signs and the nursing notes.   HISTORY  Chief Complaint Rash   HPI Justin Woodward. is a 53 y.o. male presenting for evaluation of itchy rash on his left side present for the last 3 days.  Patient states that he was working, and leaning over to grab something and his left side rubbed against a bottle of a fiberglass activator solvent.  States this was not an acidic type substance.  States the area is not at all painful.  States he first looked at the area a few hours after it got on his shirt, and states he had some itching and minimal redness.  States he has had more itching and the redness has increased.  No pain or drainage.  Denies other changes in contacts.  States he has had similar when is gotten on his hands before.  Unresolved with topical hydrocortisone and triamcinolone, but reports the triamcinolone does help.  States the itching does keep him up at night to.  Reports otherwise doing well.  Denies fevers, cough, congestion, chest pain or shortness of breath.  Titus Mould, NP: PCP    Past Medical History:  Diagnosis Date  . Hypertension   . Vertigo     There are no active problems to display for this patient.   Past Surgical History:  Procedure Laterality Date  . MANDIBLE SURGERY    . SHOULDER ARTHROSCOPY    . WRIST FRACTURE SURGERY       No current facility-administered medications for this encounter.   Current Outpatient Medications:  .  fluticasone (FLONASE) 50 MCG/ACT nasal spray, Place into both nostrils daily., Disp: , Rfl:  .  triamterene-hydrochlorothiazide (DYAZIDE) 37.5-25 MG capsule, Take 1 capsule by mouth daily., Disp: , Rfl:  .  fexofenadine (ALLEGRA) 30 MG tablet, Take 30 mg by mouth 2 (two) times daily., Disp: , Rfl:  .  hydrOXYzine (ATARAX/VISTARIL) 25 MG tablet, Take 1 tablet (25 mg total) by  mouth 3 (three) times daily as needed for itching., Disp: 15 tablet, Rfl: 0 .  meloxicam (MOBIC) 15 MG tablet, Take 1 tablet (15 mg total) by mouth daily. Start taking after finishing prednisone taper, Disp: 30 tablet, Rfl: 0 .  metaxalone (SKELAXIN) 800 MG tablet, Take 1 tablet (800 mg total) by mouth 3 (three) times daily., Disp: 21 tablet, Rfl: 0 .  methylPREDNISolone (MEDROL DOSEPAK) 4 MG TBPK tablet, Take per package instructions, Disp: 21 tablet, Rfl: 0 .  ondansetron (ZOFRAN ODT) 8 MG disintegrating tablet, Take 1 tablet (8 mg total) by mouth every 8 (eight) hours as needed., Disp: 6 tablet, Rfl: 0 .  predniSONE (DELTASONE) 10 MG tablet, Start 60 mg po day one, then 50 mg po day two, taper by 10 mg daily until complete., Disp: 21 tablet, Rfl: 0  Allergies Corn-containing products and Shellfish allergy  Family History  Problem Relation Age of Onset  . Diabetes Mother     Social History Social History   Tobacco Use  . Smoking status: Never Smoker  . Smokeless tobacco: Never Used  Substance Use Topics  . Alcohol use: No  . Drug use: No    Review of Systems Constitutional: No fever Cardiovascular: Denies chest pain. Respiratory: Denies shortness of breath. Gastrointestinal: No abdominal pain. Musculoskeletal: Negative for back pain. Skin: Positive rash  ____________________________________________   PHYSICAL EXAM:  VITAL SIGNS: ED Triage Vitals  Enc Vitals Group  BP 02/16/19 1139 114/72     Pulse Rate 02/16/19 1139 (!) 57     Resp 02/16/19 1139 18     Temp 02/16/19 1139 98.5 F (36.9 C)     Temp Source 02/16/19 1139 Oral     SpO2 02/16/19 1139 98 %     Weight 02/16/19 1142 220 lb (99.8 kg)     Height 02/16/19 1142 5\' 11"  (1.803 m)     Head Circumference --      Peak Flow --      Pain Score 02/16/19 1142 0     Pain Loc --      Pain Edu? --      Excl. in GC? --     Constitutional: Alert and oriented. Well appearing and in no acute distress. ENT       Head: Normocephalic and atraumatic. Cardiovascular: Normal rate, regular rhythm. Grossly normal heart sounds.  Good peripheral circulation. Respiratory: Normal respiratory effort without tachypnea nor retractions. Breath sounds are clear and equal bilaterally. No wheezes, rales, rhonchi. Gastrointestinal: No CVA tenderness. Musculoskeletal: Steady gait Neurologic:  Normal speech and language.  Speech is normal. No gait instability.  Skin:  Skin is warm, dry.  Except left flank laterally large area of your urticarial plaques, pruritic, nontender, no drainage, nonvesicular. Psychiatric: Mood and affect are normal. Speech and behavior are normal. Patient exhibits appropriate insight and judgment   ___________________________________________   LABS (all labs ordered are listed, but only abnormal results are displayed)  Labs Reviewed - No data to display ____________________________________________  PROCEDURES Procedures     INITIAL IMPRESSION / ASSESSMENT AND PLAN / ED COURSE  Pertinent labs & imaging results that were available during my care of the patient were reviewed by me and considered in my medical decision making (see chart for details).  Well-appearing patient.  No acute distress.  Left flank with contact dermatitis.  Unresolved with topical steroid creams, but these did help.  Does not appear to have secondary infection.  Will treat with oral prednisone and PRN hydroxyzine, continue supportive care and avoid scratching.Discussed indication, risks and benefits of medications with patient.  Discussed follow up with Primary care physician this week. Discussed follow up and return parameters including no resolution or any worsening concerns. Patient verbalized understanding and agreed to plan.   ____________________________________________   FINAL CLINICAL IMPRESSION(S) / ED DIAGNOSES  Final diagnoses:  Irritant contact dermatitis due to other chemical products     ED  Discharge Orders         Ordered    predniSONE (DELTASONE) 10 MG tablet     02/16/19 1218    hydrOXYzine (ATARAX/VISTARIL) 25 MG tablet  3 times daily PRN     02/16/19 1218           Note: This dictation was prepared with Dragon dictation along with smaller phrase technology. Any transcriptional errors that result from this process are unintentional.         Renford DillsMiller, Mishon Blubaugh, NP 02/16/19 1241

## 2020-07-05 ENCOUNTER — Emergency Department: Payer: 59

## 2020-07-05 ENCOUNTER — Other Ambulatory Visit: Payer: Self-pay

## 2020-07-05 ENCOUNTER — Encounter: Payer: Self-pay | Admitting: Emergency Medicine

## 2020-07-05 DIAGNOSIS — Z79899 Other long term (current) drug therapy: Secondary | ICD-10-CM | POA: Insufficient documentation

## 2020-07-05 DIAGNOSIS — I10 Essential (primary) hypertension: Secondary | ICD-10-CM | POA: Insufficient documentation

## 2020-07-05 DIAGNOSIS — U071 COVID-19: Secondary | ICD-10-CM | POA: Insufficient documentation

## 2020-07-05 DIAGNOSIS — R079 Chest pain, unspecified: Secondary | ICD-10-CM | POA: Diagnosis present

## 2020-07-05 LAB — CBC
HCT: 36.9 % — ABNORMAL LOW (ref 39.0–52.0)
Hemoglobin: 12.9 g/dL — ABNORMAL LOW (ref 13.0–17.0)
MCH: 30.4 pg (ref 26.0–34.0)
MCHC: 35 g/dL (ref 30.0–36.0)
MCV: 86.8 fL (ref 80.0–100.0)
Platelets: 296 10*3/uL (ref 150–400)
RBC: 4.25 MIL/uL (ref 4.22–5.81)
RDW: 12.6 % (ref 11.5–15.5)
WBC: 7.6 10*3/uL (ref 4.0–10.5)
nRBC: 0 % (ref 0.0–0.2)

## 2020-07-05 LAB — TROPONIN I (HIGH SENSITIVITY): Troponin I (High Sensitivity): 9 ng/L (ref <18)

## 2020-07-05 LAB — BASIC METABOLIC PANEL
Anion gap: 12 (ref 5–15)
BUN: 31 mg/dL — ABNORMAL HIGH (ref 6–20)
CO2: 27 mmol/L (ref 22–32)
Calcium: 9.4 mg/dL (ref 8.9–10.3)
Chloride: 99 mmol/L (ref 98–111)
Creatinine, Ser: 1.6 mg/dL — ABNORMAL HIGH (ref 0.61–1.24)
GFR calc Af Amer: 55 mL/min — ABNORMAL LOW (ref >60)
GFR calc non Af Amer: 47 mL/min — ABNORMAL LOW (ref >60)
Glucose, Bld: 135 mg/dL — ABNORMAL HIGH (ref 70–99)
Potassium: 3.7 mmol/L (ref 3.5–5.1)
Sodium: 138 mmol/L (ref 135–145)

## 2020-07-05 NOTE — ED Triage Notes (Signed)
Pt here for chest pain and SHOB. Was dx with covid 8/18 and has been cleared to go back to work.  Went to work today but started having more SHOB and fatigue and some chest pain.  Pt requesting antibody infusion, explained have to meet certain criteria to receive this.

## 2020-07-06 ENCOUNTER — Emergency Department
Admission: EM | Admit: 2020-07-06 | Discharge: 2020-07-06 | Disposition: A | Discharge status: Home / Self Care | Payer: 59 | Attending: Emergency Medicine | Admitting: Emergency Medicine

## 2020-07-06 DIAGNOSIS — U071 COVID-19: Secondary | ICD-10-CM | POA: Diagnosis not present

## 2020-07-06 DIAGNOSIS — R0602 Shortness of breath: Secondary | ICD-10-CM

## 2020-07-06 DIAGNOSIS — R0789 Other chest pain: Secondary | ICD-10-CM

## 2020-07-06 LAB — TROPONIN I (HIGH SENSITIVITY): Troponin I (High Sensitivity): 6 ng/L (ref <18)

## 2020-07-06 MED ORDER — KETOROLAC TROMETHAMINE 30 MG/ML IJ SOLN
30.0000 mg | Freq: Once | INTRAMUSCULAR | Status: AC
  Administered 2020-07-06: 30 mg via INTRAMUSCULAR
  Filled 2020-07-06: qty 1

## 2020-07-06 MED ORDER — ALBUTEROL SULFATE HFA 108 (90 BASE) MCG/ACT IN AERS: 2.0000 | INHALATION_SPRAY | Freq: Four times a day (QID) | RESPIRATORY_TRACT | 0 refills | Status: DC | PRN

## 2020-07-06 NOTE — ED Provider Notes (Signed)
[facility] Emergency Department Provider Note   ____________________________________________   First MD Initiated Contact with Patient 07/06/20 [phone]     (approximate)  I have reviewed the triage vital signs and the nursing notes.   HISTORY  Chief Complaint Chest Pain    HPI Justin Woodward. is a 54 y.o. male with past medical history of hypertension and vertigo who presents to the ED complaining of chest pain.  Patient reports that he was initially diagnosed with COVID-19 on 8/18 and had starting feeling like he was getting better until a couple of days ago.  He states his fevers have resolved and he has not been coughing as much, but when he attempted to go to work yesterday he felt very short of breath with any exertion along with pressure in his chest.  The pressure in his chest is worse when he goes to take a deep breath and he reports minimal difficulty breathing at rest.  He has not noticed any pain or swelling in his legs.  He is currently requesting antibody infusion for COVID-19.        Past Medical History:  Diagnosis Date  . Hypertension   . Vertigo     There are no problems to display for this patient.   Past Surgical History:  Procedure Laterality Date  . MANDIBLE SURGERY    . SHOULDER ARTHROSCOPY    . WRIST FRACTURE SURGERY      Prior to Admission medications   Medication Sig Start Date End Date Taking? Authorizing Provider  albuterol (VENTOLIN HFA) 108 (90 Base) MCG/ACT inhaler Inhale 2 puffs into the lungs every 6 (six) hours as needed for wheezing or shortness of breath. 07/06/20   Chesley Noon, MD  fexofenadine (ALLEGRA) 30 MG tablet Take 30 mg by mouth 2 (two) times daily.    [provider]  fluticasone (FLONASE) 50 MCG/ACT nasal spray Place into both nostrils daily.    [provider]  hydrOXYzine (ATARAX/VISTARIL) 25 MG tablet Take 1 tablet (25 mg total) by mouth 3 (three) times daily as needed for  itching. 02/16/19   Renford Dills, NP  meloxicam (MOBIC) 15 MG tablet Take 1 tablet (15 mg total) by mouth daily. Start taking after finishing prednisone taper 06/17/18   Lutricia Feil, PA-C  metaxalone (SKELAXIN) 800 MG tablet Take 1 tablet (800 mg total) by mouth 3 (three) times daily. 06/17/18   Lutricia Feil, PA-C  methylPREDNISolone (MEDROL DOSEPAK) 4 MG TBPK tablet Take per package instructions 06/17/18   Ovid Curd P, PA-C  ondansetron (ZOFRAN ODT) 8 MG disintegrating tablet Take 1 tablet (8 mg total) by mouth every 8 (eight) hours as needed. 06/15/18   [name], MD  predniSONE (DELTASONE) 10 MG tablet Start 60 mg po day one, then 50 mg po day two, taper by 10 mg daily until complete. 02/16/19   Renford Dills, NP  triamterene-hydrochlorothiazide (DYAZIDE) 37.5-25 MG capsule Take 1 capsule by mouth daily.    [provider]    Allergies Corn-containing products and Shellfish allergy  Family History  Problem Relation Age of Onset  . Diabetes Mother     Social History Social History   Tobacco Use  . Smoking status: Never Smoker  . Smokeless tobacco: Never Used  Vaping Use  . Vaping Use: Never used  Substance Use Topics  . Alcohol use: No  . Drug use: No    Review of Systems  Constitutional: No fever/chills Eyes: No visual changes. ENT:  No sore throat. Cardiovascular: Positive for chest pain. Respiratory: Positive for shortness of breath. Gastrointestinal: No abdominal pain.  No nausea, no vomiting.  No diarrhea.  No constipation. Genitourinary: Negative for dysuria. Musculoskeletal: Negative for back pain. Skin: Negative for rash. Neurological: Negative for headaches, focal weakness or numbness.  ____________________________________________   PHYSICAL EXAM:  VITAL SIGNS: ED Triage Vitals [07/05/20 1857]  Enc Vitals Group     BP 121/79     Pulse Rate 86     Resp 18     Temp 98.8 F (37.1 C)     Temp Source Oral     SpO2 97 %      Weight 220 lb (99.8 kg)     Height 5\' 10"  (1.778 m)     Head Circumference      Peak Flow      Pain Score 4     Pain Loc      Pain Edu?      Excl. in GC?     Constitutional: Alert and oriented. Eyes: Conjunctivae are normal. Head: Atraumatic. Nose: No congestion/rhinnorhea. Mouth/Throat: Mucous membranes are moist. Neck: Normal ROM Cardiovascular: Normal rate, regular rhythm. Grossly normal heart sounds. Respiratory: Normal respiratory effort.  No retractions. Lungs CTAB. Gastrointestinal: Soft and nontender. No distention. Genitourinary: deferred Musculoskeletal: No lower extremity tenderness nor edema. Neurologic:  Normal speech and language. No gross focal neurologic deficits are appreciated. Skin:  Skin is warm, dry and intact. No rash noted. Psychiatric: Mood and affect are normal. Speech and behavior are normal.  ____________________________________________   LABS (all labs ordered are listed, but only abnormal results are displayed)  Labs Reviewed  BASIC METABOLIC PANEL - Abnormal; Notable for the following components:      Result Value   Glucose, Bld 135 (*)    BUN 31 (*)    Creatinine, Ser 1.60 (*)    GFR calc non Af Amer 47 (*)    GFR calc Af Amer 55 (*)    All other components within normal limits  CBC - Abnormal; Notable for the following components:   Hemoglobin 12.9 (*)    HCT 36.9 (*)    All other components within normal limits  TROPONIN I (HIGH SENSITIVITY)  TROPONIN I (HIGH SENSITIVITY)   ____________________________________________  EKG  ED ECG REPORT I, , the attending physician, personally viewed and interpreted this ECG.   Date: 07/06/2020  EKG Time: 18:50  Rate: 85  Rhythm: normal sinus rhythm  Axis: Normal  Intervals:none  ST&T Change: None   PROCEDURES  Procedure(s) performed (including Critical Care):  Procedures   ____________________________________________   INITIAL IMPRESSION / ASSESSMENT AND PLAN / ED  COURSE       54 year old male with past medical history of hypertension and vertigo presents to the ED with chest pain and shortness of breath after testing positive for COVID-19 13 days ago.  He has minimal symptoms at rest, is not in any respiratory distress with O2 sats within normal limits on room air.  EKG shows no evidence of arrhythmia or ischemia and 2 sets of troponin are negative, I doubt ACS.  His chest x-ray is unremarkable and remainder of labs are reassuring.  I suspect he is dealing with ongoing symptoms related to COVID-19 but unfortunately he is not a candidate for antibody infusion given he is now greater than 10 days from the onset of symptoms.  I doubt PE given reassuring vital signs.  He is appropriate for discharge home and we will  treat symptomatically with albuterol inhaler.  He was counseled to follow-up with his PCP and return to the ED for new worsening symptoms, patient agrees with plan.      ____________________________________________   FINAL CLINICAL IMPRESSION(S) / ED DIAGNOSES  Final diagnoses:  COVID-19 virus infection  Atypical chest pain  SOB (shortness of breath)     ED Discharge Orders         Ordered    albuterol (VENTOLIN HFA) 108 (90 Base) MCG/ACT inhaler  Every 6 hours PRN       Note to Pharmacy: Please supply with spacer   07/06/20 3382           Note:  This document was prepared using Dragon voice recognition software and may include unintentional dictation errors.   Chesley Noon, MD 07/06/20 [phone]

## 2020-07-06 NOTE — ED Notes (Signed)
Pt unable to sign DC consent d/t sign pad broken. Pt gave this RN verbal consent for DC.

## 2021-06-19 IMAGING — CR DG CHEST 2V
1 series · 2 of 2 positions shown · non-contrast
Comparison: None

CLINICAL DATA: Chest pain, shortness of breath, [id number] diagnosis
06/23/2020

EXAM:
CHEST - 2 VIEW

[Series 1: dg chest 2 view · 0.14mm/px · 2 of 2 slices shown]
[im 1/2]
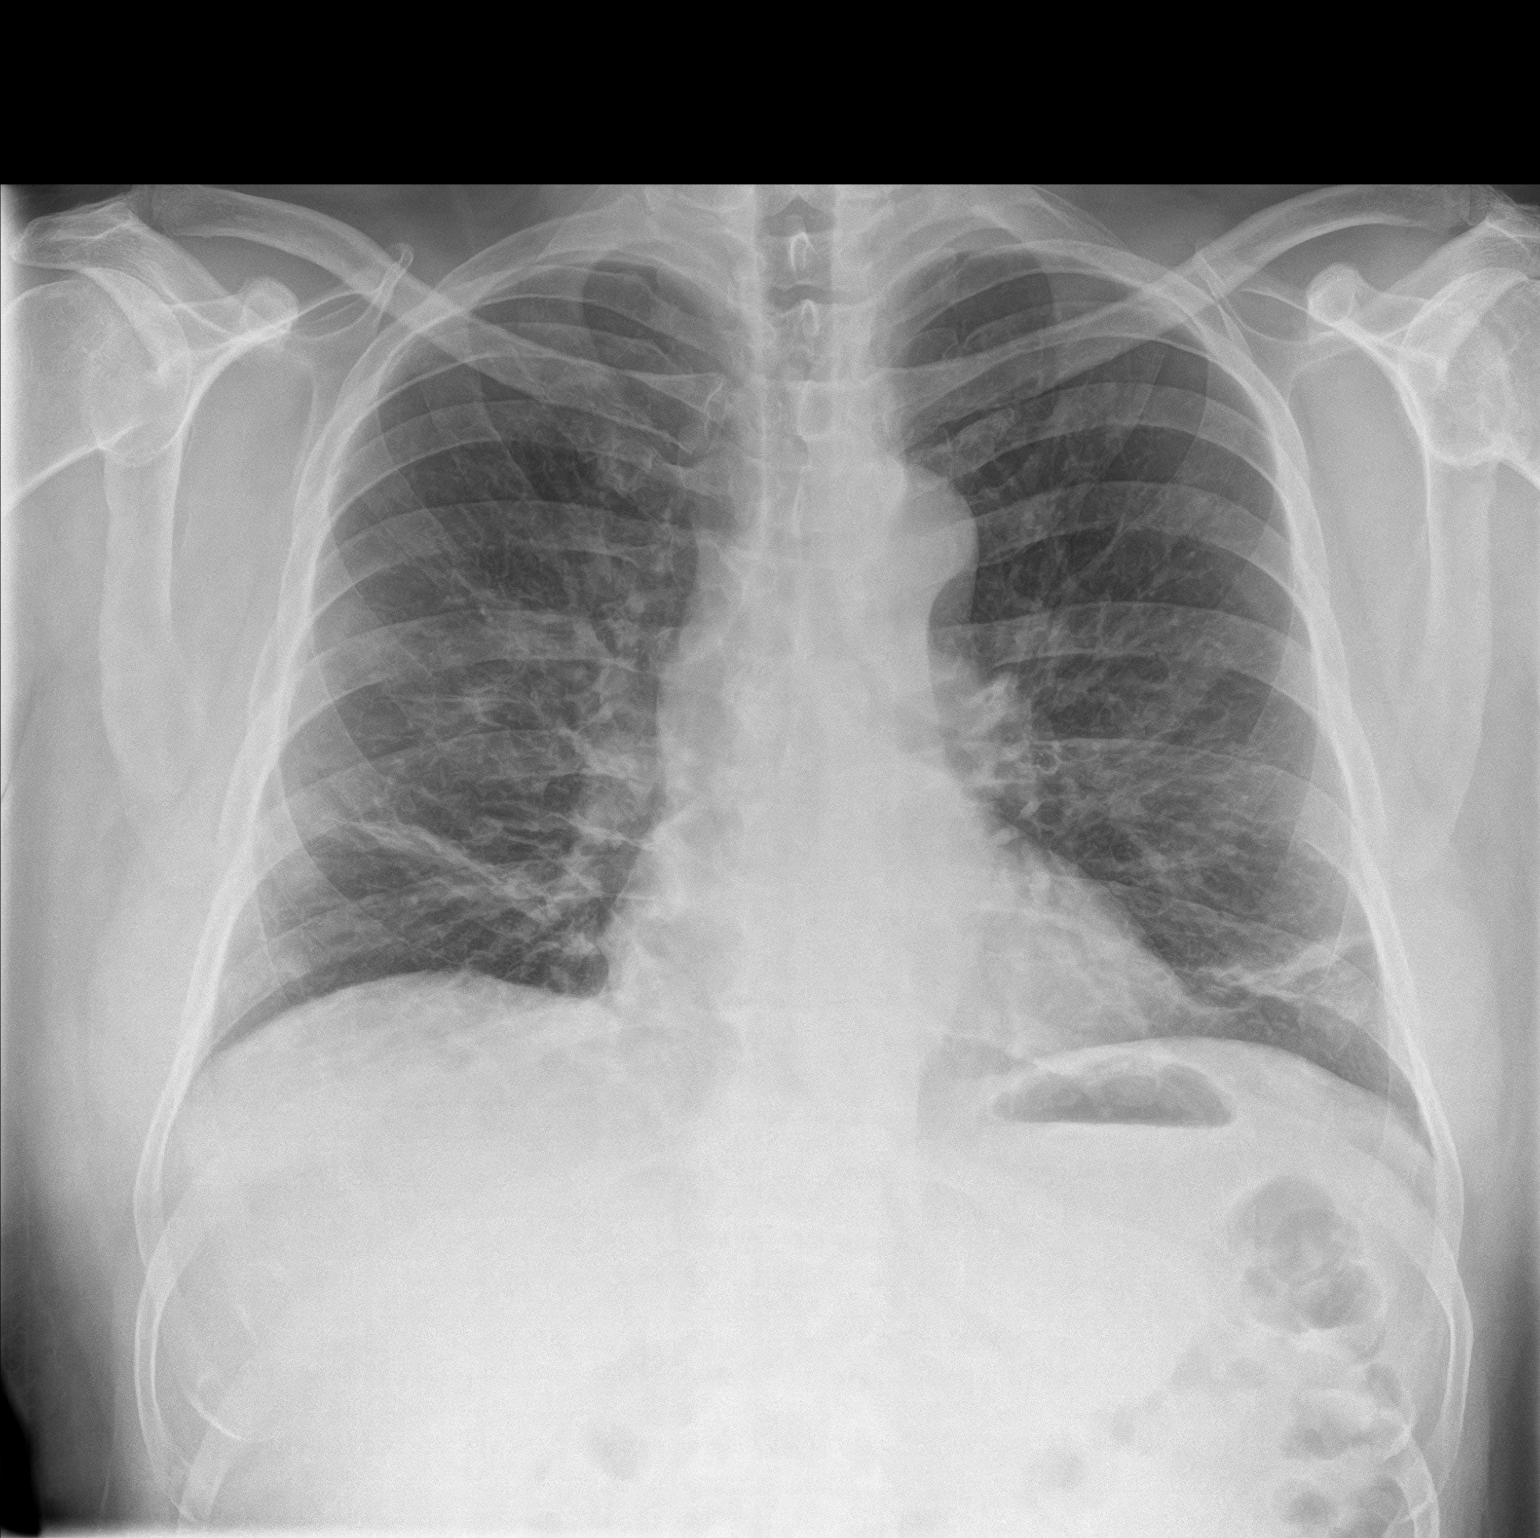
[im 2/2]
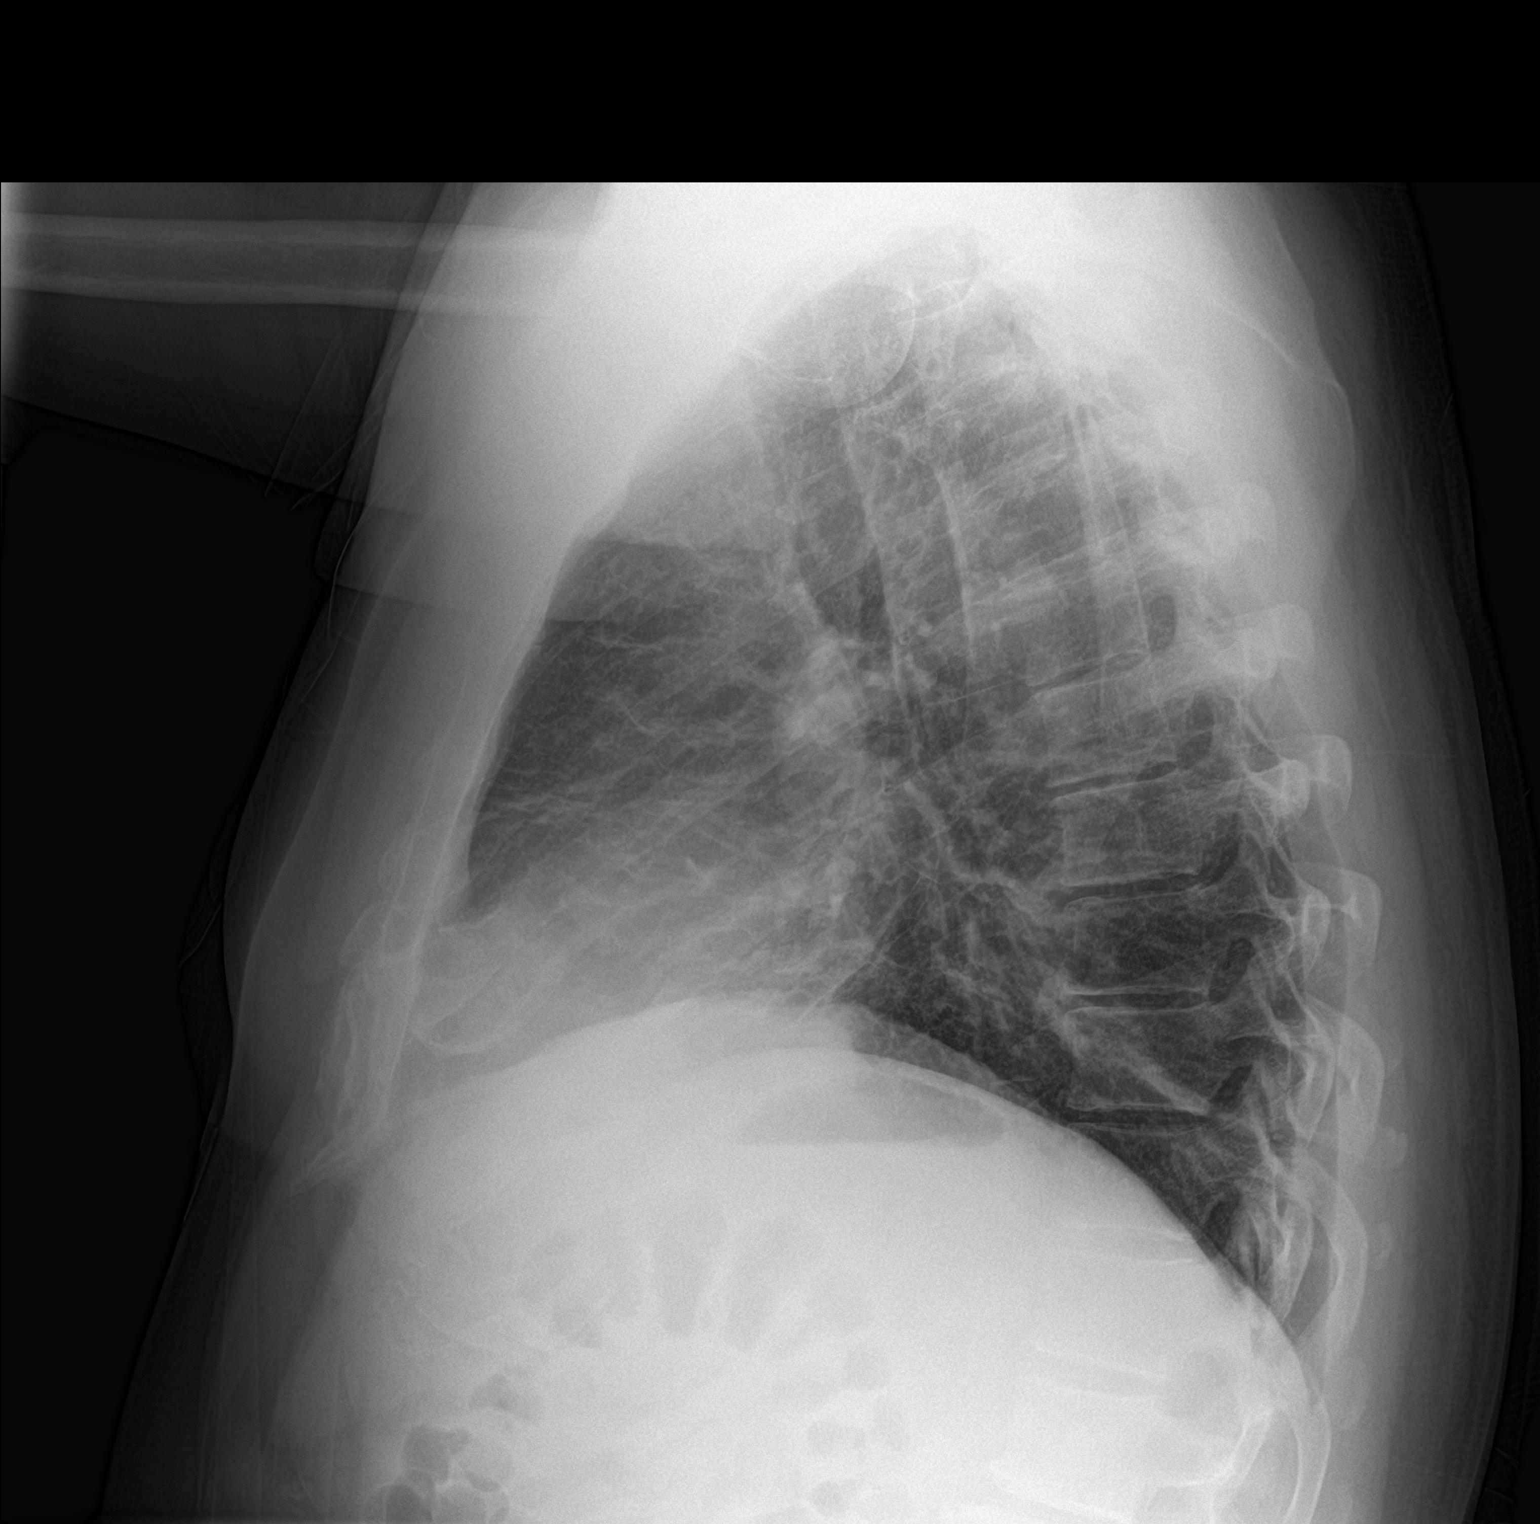

[2 of 2 positions shown; findings below may reference images not displayed]

FINDINGS: Frontal and lateral views of the chest demonstrate an unremarkable
cardiac silhouette. There is diffuse interstitial prominence, with
linear areas of consolidation at the lung bases most compatible with
scarring or subsegmental atelectasis. No definite airspace disease,
effusion, or pneumothorax. No acute bony abnormalities.
IMPRESSION: 1. Findings most consistent with background interstitial scarring
and atelectasis. No focal airspace disease.

## 2021-09-09 ENCOUNTER — Ambulatory Visit
Admission: EM | Admit: 2021-09-09 | Discharge: 2021-09-09 | Disposition: A | Discharge status: Home / Self Care | Payer: PRIVATE HEALTH INSURANCE | Attending: Emergency Medicine | Admitting: Emergency Medicine

## 2021-09-09 ENCOUNTER — Encounter: Payer: Self-pay | Admitting: Emergency Medicine

## 2021-09-09 ENCOUNTER — Other Ambulatory Visit: Payer: Self-pay

## 2021-09-09 DIAGNOSIS — H66002 Acute suppurative otitis media without spontaneous rupture of ear drum, left ear: Secondary | ICD-10-CM

## 2021-09-09 MED ORDER — AMOXICILLIN-POT CLAVULANATE 875-125 MG PO TABS: 1.0000 | ORAL_TABLET | Freq: Two times a day (BID) | ORAL | 0 refills | Status: AC

## 2021-09-09 NOTE — Discharge Instructions (Signed)
Take the Augmentin twice daily for 10 days with food for treatment of your ear infection.  Take an over-the-counter probiotic 1 hour after each dose of antibiotic to prevent diarrhea.  Use over-the-counter Tylenol and ibuprofen as needed for pain or fever.  Place a hot water bottle, or heating pad, underneath your pillowcase at night to help dilate up your ear and aid in pain relief as well as resolution of the infection.  Return for reevaluation for any new or worsening symptoms.  

## 2021-09-09 NOTE — ED Triage Notes (Signed)
Patient reports fullness and pain in his left ear for the past 2-3 days.

## 2021-09-09 NOTE — ED Provider Notes (Signed)
[facility]    CSN: [id number] Arrival date & time: 09/09/21  1825      History   Chief Complaint Chief Complaint  Patient presents with   Ear Fullness    Left ear    HPI Justin Woodward. is a 55 y.o. male.   HPI  55 year old male here for evaluation of left ear fullness.  Patient reports that he has been experiencing fullness in his left ear for 2 to 3 days.  He states he feels like it is clogged and that when he tries to pop his ear he cannot.  He has a lot of pressure buildup in the left ear.  He denies any fever, drainage, or runny nose.  He does endorse a slight nonproductive cough from time to time that has been responding to Delsym.  Past Medical History:  Diagnosis Date   Hypertension    Vertigo     There are no problems to display for this patient.   Past Surgical History:  Procedure Laterality Date   MANDIBLE SURGERY     SHOULDER ARTHROSCOPY     WRIST FRACTURE SURGERY         Home Medications    Prior to Admission medications   Medication Sig Start Date End Date Taking? Authorizing Provider  amoxicillin-clavulanate (AUGMENTIN) 875-125 MG tablet Take 1 tablet by mouth every 12 (twelve) hours for 10 days. 09/09/21 09/19/21 Yes Becky Augusta, NP  fluticasone [facility]) 50 MCG/ACT nasal spray Place into both nostrils daily.   Yes [provider]  meloxicam (MOBIC) 15 MG tablet Take 1 tablet (15 mg total) by mouth daily. Start taking after finishing prednisone taper 06/17/18  Yes Lutricia Feil, PA-C  triamterene-hydrochlorothiazide (DYAZIDE) 37.5-25 MG capsule Take 1 capsule by mouth daily.   Yes [provider]  albuterol (VENTOLIN HFA) 108 (90 Base) MCG/ACT inhaler Inhale 2 puffs into the lungs every 6 (six) hours as needed for wheezing or shortness of breath. 07/06/20   Chesley Noon, MD  fexofenadine (ALLEGRA) 30 MG tablet Take 30 mg by mouth 2 (two) times daily.    [provider]  hydrOXYzine (ATARAX/VISTARIL)  25 MG tablet Take 1 tablet (25 mg total) by mouth 3 (three) times daily as needed for itching. 02/16/19   Renford Dills, NP  metaxalone (SKELAXIN) 800 MG tablet Take 1 tablet (800 mg total) by mouth 3 (three) times daily. 06/17/18   Lutricia Feil, PA-C  methylPREDNISolone (MEDROL DOSEPAK) 4 MG TBPK tablet Take per package instructions 06/17/18   Ovid Curd P, PA-C  ondansetron (ZOFRAN ODT) 8 MG disintegrating tablet Take 1 tablet (8 mg total) by mouth every 8 (eight) hours as needed. 06/15/18   [name], MD    Family History Family History  Problem Relation Age of Onset   Diabetes Mother     Social History Social History   Tobacco Use   Smoking status: Never   Smokeless tobacco: Never  Vaping Use   Vaping Use: Never used  Substance Use Topics   Alcohol use: No   Drug use: No     Allergies   Corn-containing products and Shellfish allergy   Review of Systems Review of Systems  Constitutional:  Negative for activity change, appetite change and fever.  HENT:  Positive for ear pain. Negative for congestion, ear discharge and rhinorrhea.   Respiratory:  Positive for cough. Negative for wheezing.   Hematological: Negative.   Psychiatric/Behavioral: Negative.      Physical Exam Triage Vital  Signs ED Triage Vitals  Enc Vitals Group     BP 09/09/21 1851 129/83     Pulse Rate 09/09/21 1851 67     Resp 09/09/21 1851 16     Temp 09/09/21 1851 98.1 F (36.7 C)     Temp Source 09/09/21 1851 Oral     SpO2 09/09/21 1851 96 %     Weight 09/09/21 1848 225 lb (102.1 kg)     Height 09/09/21 1848 5\' 10"  (1.778 m)     Head Circumference --      Peak Flow --      Pain Score 09/09/21 1848 3     Pain Loc --      Pain Edu? --      Excl. in GC? --    No data found.  Updated Vital Signs BP 129/83 (BP Location: Left Arm)   Pulse 67   Temp 98.1 F (36.7 C) (Oral)   Resp 16   Ht 5\' 10"  (1.778 m)   Wt 225 lb (102.1 kg)   SpO2 96%   BMI 32.28 kg/m   Visual  Acuity Right Eye Distance:   Left Eye Distance:   Bilateral Distance:    Right Eye Near:   Left Eye Near:    Bilateral Near:     Physical Exam Vitals and nursing note reviewed.  Constitutional:      General: He is not in acute distress.    Appearance: Normal appearance. He is not ill-appearing.  HENT:     Head: Normocephalic and atraumatic.     Right Ear: Tympanic membrane, ear canal and external ear normal. There is no impacted cerumen.     Left Ear: Ear canal and external ear normal. There is no impacted cerumen.     Nose: Congestion and rhinorrhea present.     Mouth/Throat:     Mouth: Mucous membranes are moist.     Pharynx: Oropharynx is clear. Posterior oropharyngeal erythema present.  Pulmonary:     Effort: Pulmonary effort is normal.     Breath sounds: Normal breath sounds.  Musculoskeletal:     Cervical back: Normal range of motion and neck supple.  Lymphadenopathy:     Cervical: No cervical adenopathy.  Skin:    General: Skin is warm and dry.     Capillary Refill: Capillary refill takes less than 2 seconds.     Findings: No erythema or rash.  Neurological:     General: No focal deficit present.     Mental Status: He is alert and oriented to person, place, and time.  Psychiatric:        Mood and Affect: Mood normal.        Behavior: Behavior normal.        Thought Content: Thought content normal.        Judgment: Judgment normal.     UC Treatments / Results  Labs (all labs ordered are listed, but only abnormal results are displayed) Labs Reviewed - No data to display  EKG   Radiology No results found.  Procedures Procedures (including critical care time)  Medications Ordered in UC Medications - No data to display  Initial Impression / Assessment and Plan / UC Course  I have reviewed the triage vital signs and the nursing notes.  Pertinent labs & imaging results that were available during my care of the patient were reviewed by me and considered  in my medical decision making (see chart for details).  Patient is a nontoxic-appearing  55 year old male here for evaluation of left ear fullness has been going on for last 2 to 3 days.  Patient's physical exam reveals an erythematous injected left tympanic membrane with loss of landmarks.  The external auditory canal is clear.  Right TM is pearly gray with normal light reflex and clear external auditory canal.  Nasal mucosa is erythematous and edematous with scant clear nasal discharge.  Oropharyngeal exam reveals posterior oropharyngeal erythema with scant clear postnasal drip.  No cervical lymphadenopathy appreciated exam.  Cardiopulmonary exam is benign.  Patient's exam is consistent with otitis media and will treat with Augmentin twice daily for 10 days.   Final Clinical Impressions(s) / UC Diagnoses   Final diagnoses:  Non-recurrent acute suppurative otitis media of left ear without spontaneous rupture of tympanic membrane     Discharge Instructions      Take the Augmentin twice daily for 10 days with food for treatment of your ear infection.  Take an over-the-counter probiotic 1 hour after each dose of antibiotic to prevent diarrhea.  Use over-the-counter Tylenol and ibuprofen as needed for pain or fever.  Place a hot water bottle, or heating pad, underneath your pillowcase at night to help dilate up your ear and aid in pain relief as well as resolution of the infection.  Return for reevaluation for any new or worsening symptoms.      ED Prescriptions     Medication Sig Dispense Auth. Provider   amoxicillin-clavulanate (AUGMENTIN) 875-125 MG tablet Take 1 tablet by mouth every 12 (twelve) hours for 10 days. 20 tablet Becky Augusta, NP      PDMP not reviewed this encounter.   Becky Augusta, NP 09/09/21 1924

## 2021-10-11 ENCOUNTER — Other Ambulatory Visit: Payer: Self-pay

## 2021-10-11 ENCOUNTER — Ambulatory Visit
Admission: EM | Admit: 2021-10-11 | Discharge: 2021-10-11 | Disposition: A | Discharge status: Home / Self Care | Payer: 59

## 2021-10-11 DIAGNOSIS — M791 Myalgia, unspecified site: Secondary | ICD-10-CM

## 2021-10-11 DIAGNOSIS — M79652 Pain in left thigh: Secondary | ICD-10-CM

## 2021-10-11 DIAGNOSIS — M79605 Pain in left leg: Secondary | ICD-10-CM

## 2021-10-11 NOTE — ED Provider Notes (Signed)
[facility]    CSN: [id number] Arrival date & time: 10/11/21  1933      History   Chief Complaint Chief Complaint  Patient presents with   Leg Pain    HPI Justin Woodward. is a 55 y.o. male.   55 year old male patient, Justin Woodward, Justin Woodward. presents to urgent care with chief complaint of left thigh pain for a month.  Patient states "its not sciatica , I have had sciatica before", patient reports "pain starts in left thigh and runs down my knee", not sure if I have a blood clot , but I don't think so, I don't have any swelling, no pain, no redness, why does it hurt". Pt denies injury, trauma, no meds or treatment tried.   The history is provided by the patient. No language interpreter was used.  Leg Pain Location:  Leg Leg location:  L upper leg Pain details:    Quality:  Aching and pressure   Radiates to:  Does not radiate   Severity:  Mild   Onset quality: after sitting and going to stance.   Duration:  1 month   Timing:  Intermittent   Progression:  Waxing and waning Chronicity:  New Dislocation: no   Foreign body present:  No foreign bodies Tetanus status:  Unknown Prior injury to area:  No Relieved by:  None tried Exacerbated by: sitting,driving. Ineffective treatments:  None tried Associated symptoms: no back pain, no decreased ROM, no fever, no numbness, no swelling and no tingling   Associated symptoms comment:  No erythema Risk factors: no recent illness   Risk factors comment:  Overweight  Past Medical History:  Diagnosis Date   Hypertension    Vertigo     Patient Active Problem List   Diagnosis Date Noted   Left leg pain 10/11/2021   Acute thigh pain, left 10/11/2021    Past Surgical History:  Procedure Laterality Date   MANDIBLE SURGERY     SHOULDER ARTHROSCOPY     WRIST FRACTURE SURGERY         Home Medications    Prior to Admission medications   Medication Sig Start Date End Date Taking? Authorizing Provider  fexofenadine  (ALLEGRA) 30 MG tablet Take 30 mg by mouth 2 (two) times daily.   Yes [provider]  fluticasone (FLONASE) 50 MCG/ACT nasal spray Place into both nostrils daily.   Yes [provider]  metaxalone (SKELAXIN) 800 MG tablet Take 1 tablet (800 mg total) by mouth 3 (three) times daily. 06/17/18  Yes Lutricia Feil, PA-C  triamterene-hydrochlorothiazide (DYAZIDE) 37.5-25 MG capsule Take 1 capsule by mouth daily.   Yes [provider]  albuterol (VENTOLIN HFA) 108 (90 Base) MCG/ACT inhaler Inhale 2 puffs into the lungs every 6 (six) hours as needed for wheezing or shortness of breath. 07/06/20   Chesley Noon, MD  hydrOXYzine (ATARAX/VISTARIL) 25 MG tablet Take 1 tablet (25 mg total) by mouth 3 (three) times daily as needed for itching. 02/16/19   Renford Dills, NP  meloxicam (MOBIC) 15 MG tablet Take 1 tablet (15 mg total) by mouth daily. Start taking after finishing prednisone taper 06/17/18   Lutricia Feil, PA-C  methylPREDNISolone (MEDROL DOSEPAK) 4 MG TBPK tablet Take per package instructions 06/17/18   Ovid Curd P, PA-C  ondansetron (ZOFRAN ODT) 8 MG disintegrating tablet Take 1 tablet (8 mg total) by mouth every 8 (eight) hours as needed. 06/15/18   [name], MD    Family History Family  History  Problem Relation Age of Onset   Diabetes Mother     Social History Social History   Tobacco Use   Smoking status: Never   Smokeless tobacco: Never  Vaping Use   Vaping Use: Never used  Substance Use Topics   Alcohol use: No   Drug use: No     Allergies   Corn-containing products and Shellfish allergy   Review of Systems Review of Systems  Constitutional:  Negative for fever.  Cardiovascular:  Negative for chest pain, palpitations and leg swelling.  Genitourinary:  Negative for dysuria.  Musculoskeletal:  Positive for myalgias. Negative for back pain and gait problem.  Skin:  Negative for rash.  All other systems reviewed and are  negative.   Physical Exam Triage Vital Signs ED Triage Vitals  Enc Vitals Group     BP 10/11/21 1943 (!) 128/93     Pulse Rate 10/11/21 1943 66     Resp 10/11/21 1943 16     Temp 10/11/21 1943 98.1 F (36.7 C)     Temp Source 10/11/21 1943 Oral     SpO2 --      Weight 10/11/21 1938 225 lb 1.4 oz (102.1 kg)     Height 10/11/21 1938 5\' 10"  (1.778 m)     Head Circumference --      Peak Flow --      Pain Score 10/11/21 1937 10     Pain Loc --      Pain Edu? --      Excl. in GC? --    No data found.  Updated Vital Signs BP (!) 128/93 (BP Location: Left Arm)   Pulse 66   Temp 98.1 F (36.7 C) (Oral)   Resp 16   Ht 5\' 10"  (1.778 m)   Wt 225 lb 1.4 oz (102.1 kg)   BMI 32.30 kg/m   Visual Acuity Right Eye Distance:   Left Eye Distance:   Bilateral Distance:    Right Eye Near:   Left Eye Near:    Bilateral Near:     Physical Exam Vitals and nursing note reviewed. Exam conducted with a chaperone present.  Constitutional:      General: He is not in acute distress.    Appearance: He is well-developed.  HENT:     Head: Normocephalic and atraumatic.  Eyes:     Conjunctiva/sclera: Conjunctivae normal.  Cardiovascular:     Rate and Rhythm: Normal rate and regular rhythm.     Pulses: Normal pulses.     Heart sounds: No murmur heard. Pulmonary:     Effort: Pulmonary effort is normal. No respiratory distress.  Abdominal:     Palpations: Abdomen is soft.     Tenderness: There is no abdominal tenderness.  Musculoskeletal:        General: No swelling.     Cervical back: Normal range of motion and neck supple.     Left upper leg: Normal. No swelling, edema, deformity, lacerations, tenderness or bony tenderness.     Comments: Pt has small spider vein noted to mid left thigh, no erythema, no rash  Skin:    General: Skin is warm and dry.     Capillary Refill: Capillary refill takes less than 2 seconds.  Neurological:     General: No focal deficit present.     Mental  Status: He is alert and oriented to person, place, and time.     GCS: GCS eye subscore is 4. GCS verbal subscore is  5. GCS motor subscore is 6.     Cranial Nerves: Cranial nerves 2-12 are intact.     Sensory: Sensation is intact.     Motor: Motor function is intact.     Coordination: Coordination is intact.  Psychiatric:        Attention and Perception: Attention normal.        Mood and Affect: Mood normal.        Speech: Speech normal.        Behavior: Behavior normal.     UC Treatments / Results  Labs (all labs ordered are listed, but only abnormal results are displayed) Labs Reviewed - No data to display  EKG   Radiology No results found.  Procedures Procedures (including critical care time)  Medications Ordered in UC Medications - No data to display  Initial Impression / Assessment and Plan / UC Course  I have reviewed the triage vital signs and the nursing notes.  Pertinent labs & imaging results that were available during my care of the patient were reviewed by me and considered in my medical decision making (see chart for details).     WEX:HBZJIRC, Varicose vein, Muscle strain, neuralgia, DVT  Wells score is 0.0 today in office. Final Clinical Impressions(s) / UC Diagnoses   Final diagnoses:  Acute thigh pain, left  Myalgia     Discharge Instructions      This does not appear to be an urgent/emergent issue. Please follow up with your PCP. May take otc tylenol/ 650 mg every 6 hours or ibuprofen 600 mg every 8 hours(as label directed). Follow up with PCP. Go to [facility] you develop redness, swelling, loss of function, etc. May use OTC biofreeze as label directed.    ED Prescriptions   None    PDMP not reviewed this encounter.   Clancy Gourd, NP 10/11/21 2003

## 2021-10-11 NOTE — Discharge Instructions (Addendum)
This does not appear to be an urgent/emergent issue. Please follow up with your PCP. May take otc tylenol/ 650 mg every 6 hours or ibuprofen 600 mg every 8 hours(as label directed). Follow up with PCP. Go to [facility] you develop redness, swelling, loss of function, etc. May use OTC biofreeze as label directed.

## 2021-10-11 NOTE — ED Triage Notes (Signed)
Pt reports left leg pain, hurts worse with sitting down, hurts worse with driving. Sxs x 1 month

## 2021-10-12 ENCOUNTER — Emergency Department: Payer: 59

## 2021-10-12 ENCOUNTER — Encounter: Payer: Self-pay | Admitting: Emergency Medicine

## 2021-10-12 ENCOUNTER — Other Ambulatory Visit: Payer: Self-pay

## 2021-10-12 DIAGNOSIS — I10 Essential (primary) hypertension: Secondary | ICD-10-CM | POA: Insufficient documentation

## 2021-10-12 DIAGNOSIS — G5712 Meralgia paresthetica, left lower limb: Secondary | ICD-10-CM | POA: Diagnosis not present

## 2021-10-12 DIAGNOSIS — M79652 Pain in left thigh: Secondary | ICD-10-CM | POA: Diagnosis present

## 2021-10-12 NOTE — ED Triage Notes (Signed)
Pt in with L anterior thigh pain, constant and ongoing x 1 mo. Pt denies any injury/bruising/swelling to area, sitting makes pain worse. Went to UC yesterday, and Tylenol/Ibuprofen not helping.

## 2021-10-13 ENCOUNTER — Emergency Department
Admission: EM | Admit: 2021-10-13 | Discharge: 2021-10-13 | Disposition: A | Discharge status: Home / Self Care | Payer: 59 | Attending: Emergency Medicine | Admitting: Emergency Medicine

## 2021-10-13 DIAGNOSIS — G5712 Meralgia paresthetica, left lower limb: Secondary | ICD-10-CM | POA: Diagnosis not present

## 2021-10-13 MED ORDER — KETOROLAC TROMETHAMINE 30 MG/ML IJ SOLN
60.0000 mg | Freq: Once | INTRAMUSCULAR | Status: AC
  Administered 2021-10-13: 60 mg via INTRAMUSCULAR
  Filled 2021-10-13: qty 2

## 2021-10-13 MED ORDER — PREDNISONE 10 MG (21) PO TBPK: ORAL_TABLET | ORAL | 0 refills | Status: DC

## 2021-10-13 MED ORDER — MELOXICAM 15 MG PO TABS: 15.0000 mg | ORAL_TABLET | Freq: Every day | ORAL | 1 refills | Status: AC

## 2021-10-13 MED ORDER — DEXAMETHASONE SODIUM PHOSPHATE 10 MG/ML IJ SOLN
10.0000 mg | Freq: Once | INTRAMUSCULAR | Status: AC
Start: 2021-10-13 — End: 2021-10-13
  Administered 2021-10-13: 10 mg via INTRAMUSCULAR
  Filled 2021-10-13: qty 1

## 2021-10-13 MED ORDER — ONDANSETRON 4 MG PO TBDP: 4.0000 mg | ORAL_TABLET | Freq: Four times a day (QID) | ORAL | 0 refills | Status: DC | PRN

## 2021-10-13 MED ORDER — HYDROCODONE-ACETAMINOPHEN 5-325 MG PO TABS: 2.0000 | ORAL_TABLET | Freq: Four times a day (QID) | ORAL | 0 refills | Status: DC | PRN

## 2021-10-13 MED ORDER — KETOROLAC TROMETHAMINE 30 MG/ML IJ SOLN
INTRAMUSCULAR | Status: AC
  Filled 2021-10-13: qty 1

## 2021-10-13 NOTE — ED Notes (Signed)
Pt given cup of water 

## 2021-10-13 NOTE — ED Provider Notes (Signed)
[facility] Emergency Department Provider Note  ____________________________________________   Event Date/Time   First MD Initiated Contact with Patient 10/13/21 0210     (approximate)  I have reviewed the triage vital signs and the nursing notes.   HISTORY  Chief Complaint L Thigh Pain    HPI Justin Woodward. is a 55 y.o. male with hypertension who presents to the emergency department with left anterior and lateral thigh pain that he describes as a burning that is worse with sitting that has been ongoing for about a month and a half.  Was seen at urgent care recently and was instructed to alternate Tylenol and ibuprofen and states he has been taking these regularly without any relief.  He denies any injury to his leg or back.  No back pain, numbness, tingling or focal weakness.  No bowel or bladder incontinence.  No fever.  No calf swelling or calf tenderness.  No overlying skin changes.  States he is able to ambulate without difficulty.  States he wakes up in the morning and he feels great but on the drive to work is when he starts having increasing pain.  He does not put his wallet in the back pocket of his pants on this side.  He has a PCP but has not yet seen them for follow-up.        Past Medical History:  Diagnosis Date   Hypertension    Vertigo     Patient Active Problem List   Diagnosis Date Noted   Left leg pain 10/11/2021   Acute thigh pain, left 10/11/2021    Past Surgical History:  Procedure Laterality Date   MANDIBLE SURGERY     SHOULDER ARTHROSCOPY     WRIST FRACTURE SURGERY      Prior to Admission medications   Medication Sig Start Date End Date Taking? Authorizing Provider  HYDROcodone-acetaminophen (NORCO/VICODIN) 5-325 MG tablet Take 2 tablets by mouth every 6 (six) hours as needed. 10/13/21  Yes Nevaeh Casillas, Delice Bison, DO  meloxicam (MOBIC) 15 MG tablet Take 1 tablet (15 mg total) by mouth daily. 10/13/21 12/12/21 Yes Lliam Hoh N,  DO  ondansetron (ZOFRAN-ODT) 4 MG disintegrating tablet Take 1 tablet (4 mg total) by mouth every 6 (six) hours as needed for nausea or vomiting. 10/13/21  Yes Jerlisa Diliberto, Delice Bison, DO  predniSONE (STERAPRED UNI-PAK 21 TAB) 10 MG (21) TBPK tablet Take as directed 10/13/21  Yes Bertice Risse N, DO  albuterol (VENTOLIN HFA) 108 (90 Base) MCG/ACT inhaler Inhale 2 puffs into the lungs every 6 (six) hours as needed for wheezing or shortness of breath. 07/06/20   Blake Divine, MD  fexofenadine (ALLEGRA) 30 MG tablet Take 30 mg by mouth 2 (two) times daily.    [provider]  fluticasone (FLONASE) 50 MCG/ACT nasal spray Place into both nostrils daily.    [provider]  hydrOXYzine (ATARAX/VISTARIL) 25 MG tablet Take 1 tablet (25 mg total) by mouth 3 (three) times daily as needed for itching. 02/16/19   Marylene Land, NP  triamterene-hydrochlorothiazide (DYAZIDE) 37.5-25 MG capsule Take 1 capsule by mouth daily.    [provider]    Allergies Corn-containing products and Shellfish allergy  Family History  Problem Relation Age of Onset   Diabetes Mother     Social History Social History   Tobacco Use   Smoking status: Never   Smokeless tobacco: Never  Vaping Use   Vaping Use: Never used  Substance Use Topics  Alcohol use: No   Drug use: No    Review of Systems Constitutional: No fever. Eyes: No visual changes. ENT: No sore throat. Cardiovascular: Denies chest pain. Respiratory: Denies shortness of breath. Gastrointestinal: No nausea, vomiting, diarrhea. Genitourinary: Negative for dysuria. Musculoskeletal: Negative for back pain. Skin: Negative for rash. Neurological: Negative for focal weakness or numbness.  ____________________________________________   PHYSICAL EXAM:  VITAL SIGNS: ED Triage Vitals  Enc Vitals Group     BP 10/12/21 2001 (!) 148/100     Pulse Rate 10/12/21 2001 71     Resp 10/12/21 2001 18     Temp 10/12/21 2001 98.4 F (36.9  C)     Temp Source 10/12/21 2001 Oral     SpO2 10/12/21 2001 96 %     Weight 10/12/21 2000 220 lb (99.8 kg)     Height 10/12/21 2000 5\' 10"  (1.778 m)     Head Circumference --      Peak Flow --      Pain Score --      Pain Loc --      Pain Edu? --      Excl. in [place]? --    CONSTITUTIONAL: Alert and oriented and responds appropriately to questions. Well-appearing; well-nourished HEAD: Normocephalic EYES: Conjunctivae clear, pupils appear equal, EOM appear intact ENT: normal nose; moist mucous membranes NECK: Supple, normal ROM CARD: RRR; S1 and S2 appreciated; no murmurs, no clicks, no rubs, no gallops RESP: Normal chest excursion without splinting or tachypnea; breath sounds clear and equal bilaterally; no wheezes, no rhonchi, no rales, no hypoxia or respiratory distress, speaking full sentences ABD/GI: Normal bowel sounds; non-distended; soft, non-tender, no rebound, no guarding, no peritoneal signs, no hepatosplenomegaly BACK: The back appears normal, no midline spinal tenderness or step-off or deformity EXT: Normal ROM in all joints; no deformity noted, no edema; no cyanosis, no redness or warmth noted to the left lower extremity, 2+ left DP pulse.  Compartments in the left leg are soft.  No joint effusion or bony tenderness, bony deformity. SKIN: Normal color for age and race; warm; no rash on exposed skin NEURO: Moves all extremities equally, normal sensation in bilateral lower extremities, normal gait, no saddle anesthesia, no clonus PSYCH: The patient's mood and manner are appropriate.  ____________________________________________   LABS (all labs ordered are listed, but only abnormal results are displayed)  Labs Reviewed - No data to display ____________________________________________  EKG   ____________________________________________  RADIOLOGY I, Zayneb Baucum, personally viewed and evaluated these images (plain radiographs) as part of my medical decision making, as  well as reviewing the written report by the radiologist.  ED MD interpretation: X-ray of the left femur shows no acute abnormality.  Ultrasound of the left lower extremity shows no DVT.  Official radiology report(s): US Venous Img Lower Unilateral Left  Result Date: 10/12/2021 CLINICAL DATA:  Left leg pain and swelling EXAM: LEFT LOWER EXTREMITY VENOUS DOPPLER ULTRASOUND TECHNIQUE: Gray-scale sonography with compression, as well as color and duplex ultrasound, were performed to evaluate the deep venous system(s) from the level of the common femoral vein through the popliteal and proximal calf veins. COMPARISON:  None. FINDINGS: VENOUS Normal compressibility of the common femoral, superficial femoral, and popliteal veins, as well as the visualized calf veins. Visualized portions of profunda femoral vein and great saphenous vein unremarkable. No filling defects to suggest DVT on grayscale or color Doppler imaging. Doppler waveforms show normal direction of venous flow, normal respiratory plasticity and response to augmentation. Limited  views of the contralateral common femoral vein are unremarkable. OTHER None. Limitations: none IMPRESSION: Negative. Electronically Signed   By: Fidela Salisbury M.D.   On: 10/12/2021 22:28   DG Femur Min 2 Views Left  Result Date: 10/12/2021 CLINICAL DATA:  Pain in the LEFT femur in a 55 year old male. EXAM: LEFT FEMUR 2 VIEWS COMPARISON:  None FINDINGS: No sign of acute fracture or dislocation. Calcification in the medial thigh likely relates to dystrophic calcification or myositis ossificans. No additional soft tissue abnormality. IMPRESSION: No acute fracture or dislocation. Electronically Signed   By: Zetta Bills M.D.   On: 10/12/2021 20:32    ____________________________________________   PROCEDURES  Procedure(s) performed (including Critical Care):  Procedures   ____________________________________________   INITIAL IMPRESSION / ASSESSMENT AND PLAN / ED  COURSE  As part of my medical decision making, I reviewed the following data within the [place] notes reviewed and incorporated, Old chart reviewed, Radiograph reviewed , ultrasound reviewed, Notes from prior ED visits, and [place] Controlled Substance Database         Patient here with symptoms of meralgia paresthetica of the left lower extremity.  He is overweight but not significantly obese.  I have recommended that he avoid using tight waistbands, belts.  He does not put his wallet in his back pocket on the sign.  X-ray obtained from triage shows no bony abnormality of the left femur.  Ultrasound shows no DVT.  He is neurovascular intact distally without sign of arterial obstruction, compartment syndrome, septic arthritis, gout, cellulitis.  This seems atypical for radiculopathy but did refer him to orthopedics if symptoms or not improving with continued anti-inflammatories.  Will discharge with steroid taper just in case this is radicular in nature.  Recommended close follow-up with his PCP as well.  Given prescription for pain medication.  Patient declines work note.  At this time, I do not feel there is any life-threatening condition present. I have reviewed, interpreted and discussed all results (EKG, imaging, lab, urine as appropriate) and exam findings with patient/family. I have reviewed nursing notes and appropriate previous records.  I feel the patient is safe to be discharged home without further emergent workup and can continue workup as an outpatient as needed. Discussed usual and customary return precautions. Patient/family verbalize understanding and are comfortable with this plan.  Outpatient follow-up has been provided as needed. All questions have been answered.  ____________________________________________   FINAL CLINICAL IMPRESSION(S) / ED DIAGNOSES  Final diagnoses:  Meralgia paraesthetica, left     ED Discharge Orders          Ordered     predniSONE (STERAPRED UNI-PAK 21 TAB) 10 MG (21) TBPK tablet        10/13/21 0251    ondansetron (ZOFRAN-ODT) 4 MG disintegrating tablet  Every 6 hours PRN        10/13/21 0251    HYDROcodone-acetaminophen (NORCO/VICODIN) 5-325 MG tablet  Every 6 hours PRN        10/13/21 0251    meloxicam (MOBIC) 15 MG tablet  Daily        10/13/21 0251            *Please note:  Tytus Willinger. was evaluated in Emergency Department on 10/13/2021 for the symptoms described in the history of present illness. He was evaluated in the context of the global COVID-19 pandemic, which necessitated consideration that the patient might be at risk for infection with the SARS-CoV-2 virus that causes COVID-19.  Institutional protocols and algorithms that pertain to the evaluation of patients at risk for COVID-19 are in a state of rapid change based on information released by regulatory bodies including the CDC and federal and state organizations. These policies and algorithms were followed during the patient's care in the ED.  Some ED evaluations and interventions may be delayed as a result of limited staffing during and the pandemic.*   Note:  This document was prepared using Dragon voice recognition software and may include unintentional dictation errors.    Berenize Gatlin, Layla Maw, DO 10/13/21 [phone]

## 2021-10-13 NOTE — Discharge Instructions (Addendum)
I recommend making sure that you are not wearing tight belts or waist bands in your pants or underwear as this can exacerbate your symptoms.  I recommend continued anti-inflammatories and close follow-up with your primary care physician and orthopedics if symptoms are not improving.  I am putting you on a steroid taper in case this is radiculopathy (nerve impingement) from your back.  If you develop fever of 100.4 or higher, redness or warmth of this leg, chest pain or shortness of breath, numbness or weakness, difficulty holding your bowel or bladder, please return to the emergency department.  You are being provided a prescription for opiates (also known as narcotics) for pain control.  Opiates can be addictive and should only be used when absolutely necessary for pain control when other alternatives do not work.  We recommend you only use them for the recommended amount of time and only as prescribed.  Please do not take with other sedative medications or alcohol.  Please do not drive, operate machinery, make important decisions while taking opiates.  Please note that these medications can be addictive and have high abuse potential.  Patients can become addicted to narcotics after only taking them for a few days.  Please keep these medications locked away from children, teenagers or any family members with history of substance abuse.  Narcotic pain medicine may also make you constipated.  You may use over-the-counter medications such as MiraLAX, Colace to prevent constipation.  If you become constipated you may use over-the-counter enemas as needed.  Itching and nausea are common side effects of narcotic pain medication.  If you develop uncontrolled vomiting or a rash, please stop these medications.

## 2022-09-26 IMAGING — US US EXTREM LOW VENOUS*L*
1 series · 14 of 24 positions shown · non-contrast
Comparison: None.

CLINICAL DATA: Left leg pain and swelling

EXAM:
LEFT LOWER EXTREMITY VENOUS DOPPLER ULTRASOUND
TECHNIQUE: Gray-scale sonography with compression, as well as color and duplex
ultrasound, were performed to evaluate the deep venous system(s)
from the level of the common femoral vein through the popliteal and
proximal calf veins.

[Series 1: us venous img lower uni left (dvt) · portal-venous · 14 of 34 slices shown]
[im 1/34]
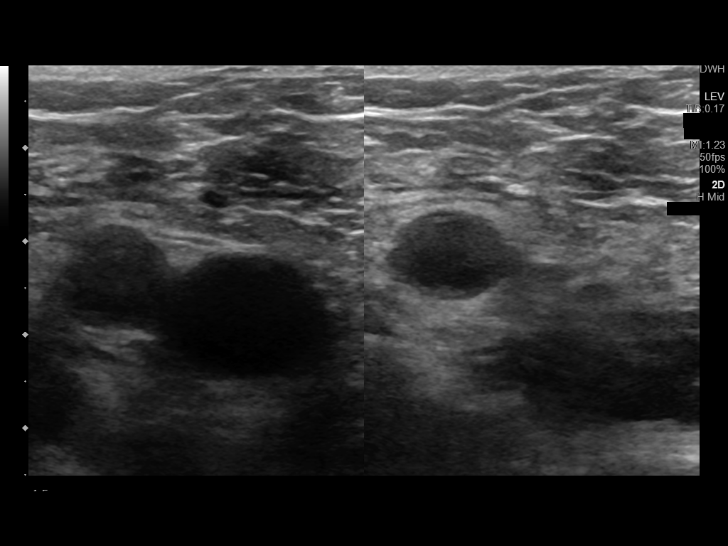
[im 3/34]
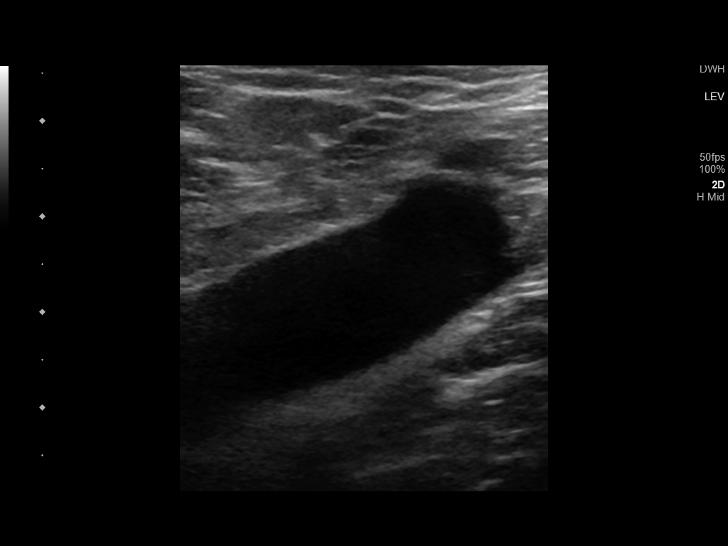
[im 6/34]
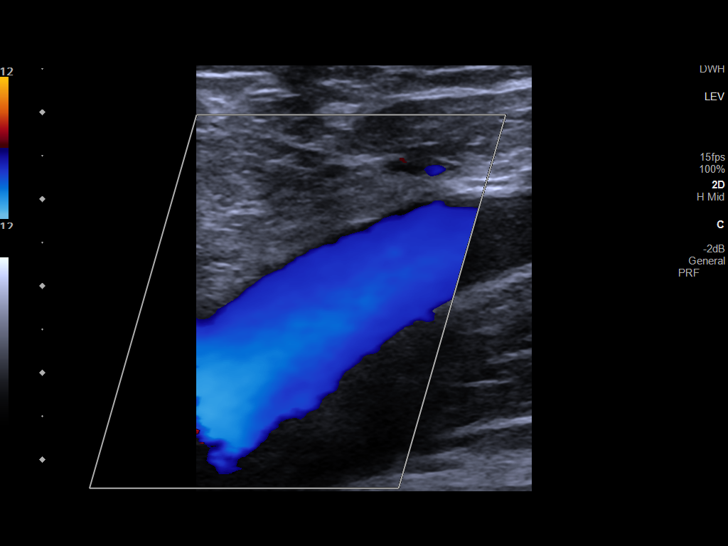
[im 9/34]
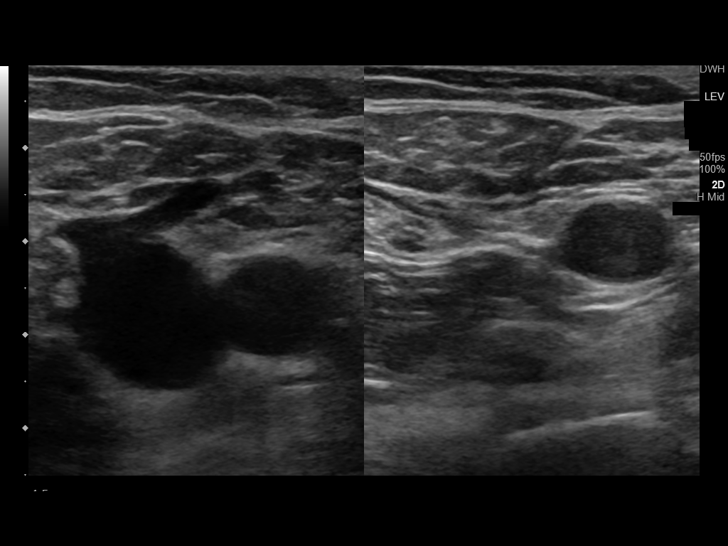
[im 11/34]
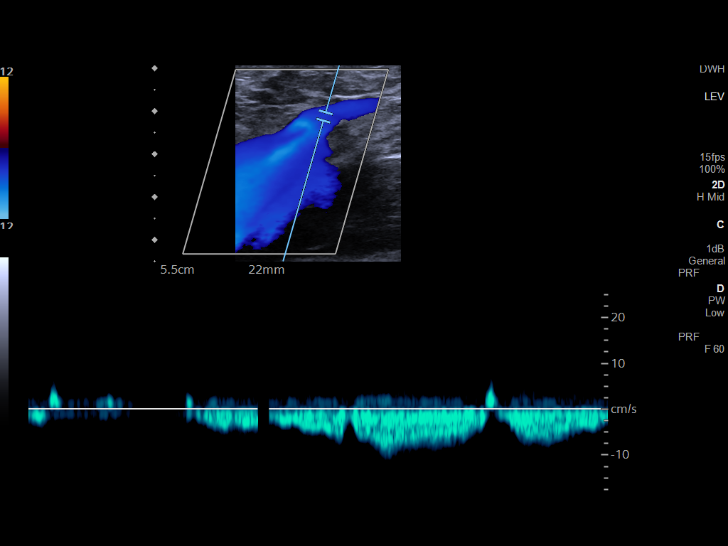
[im 13/34]
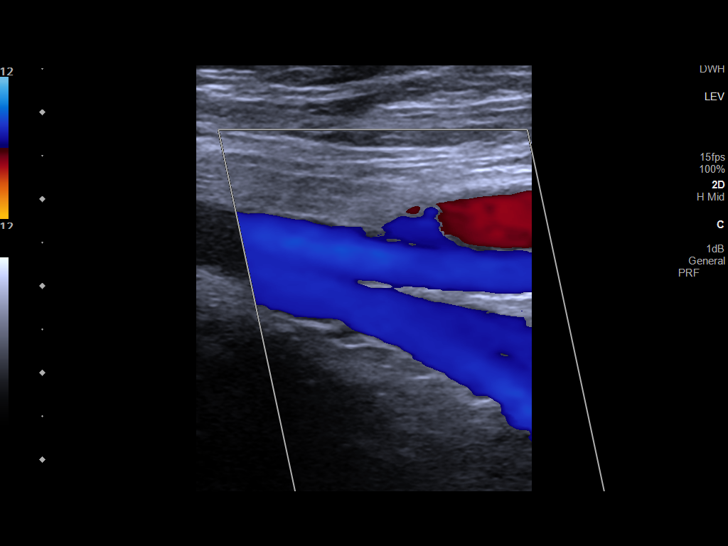
[im 16/34]
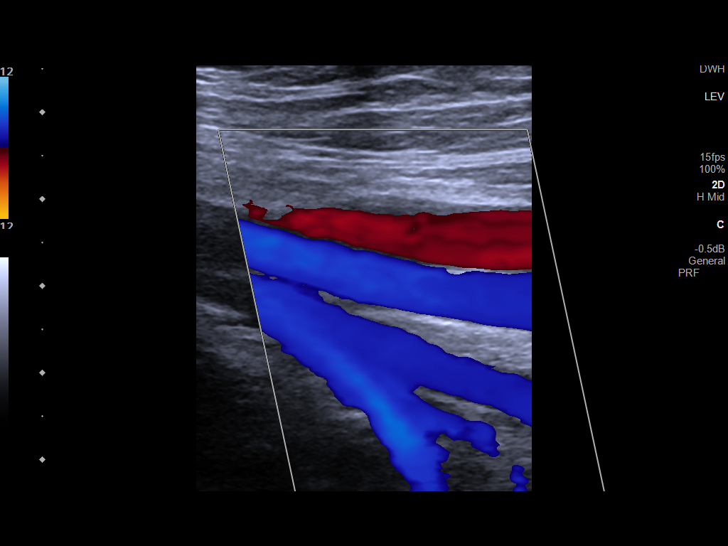
[im 18/34]
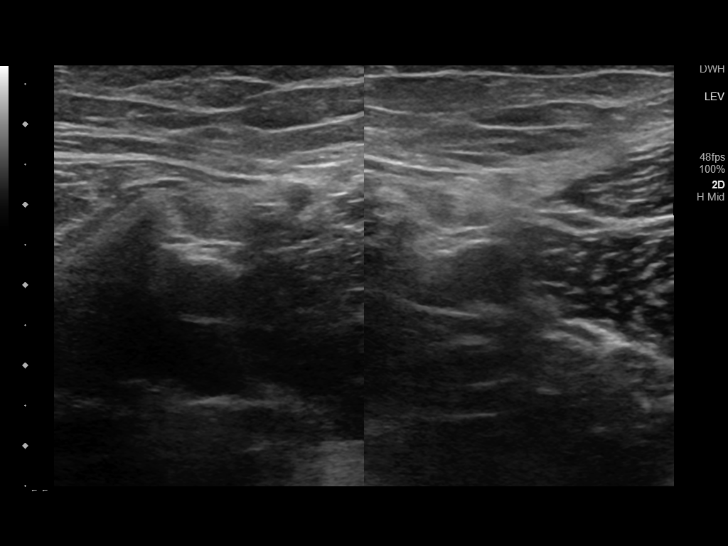
[im 21/34]
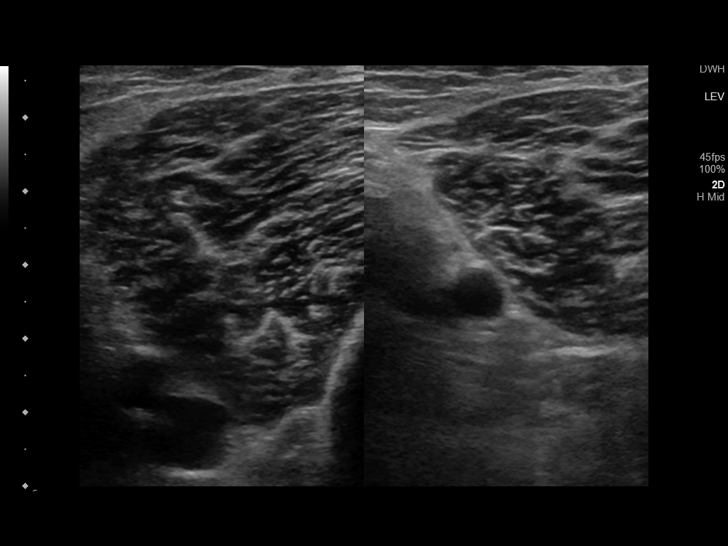
[im 23/34]
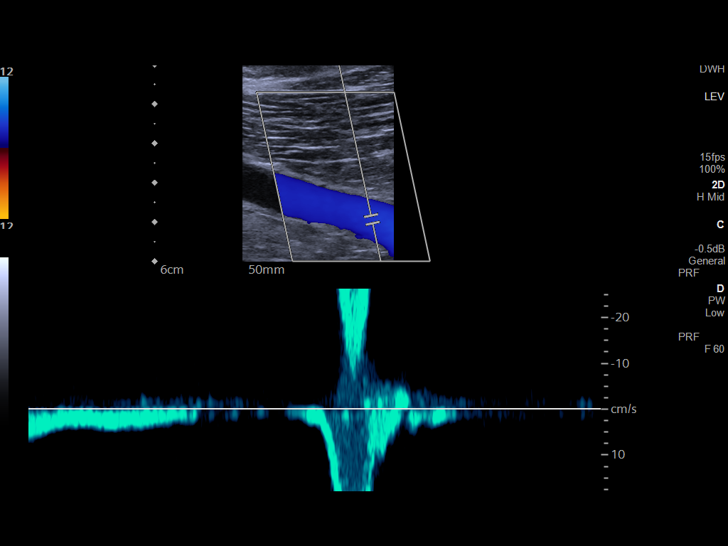
[im 26/34]
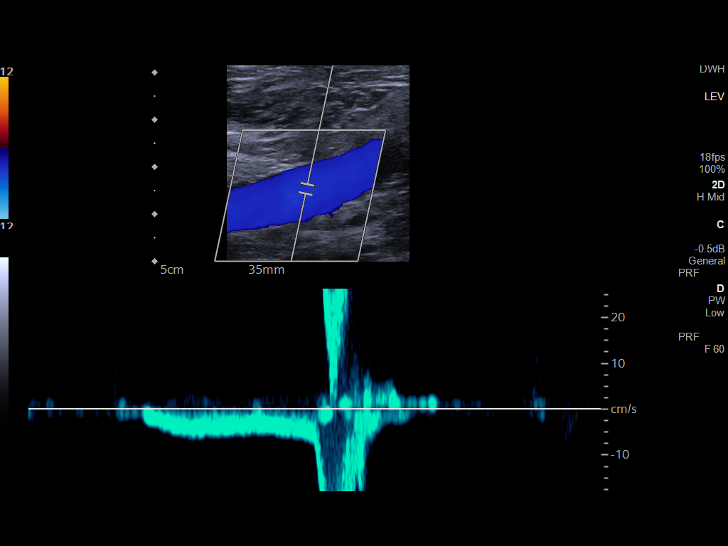
[im 28/34]
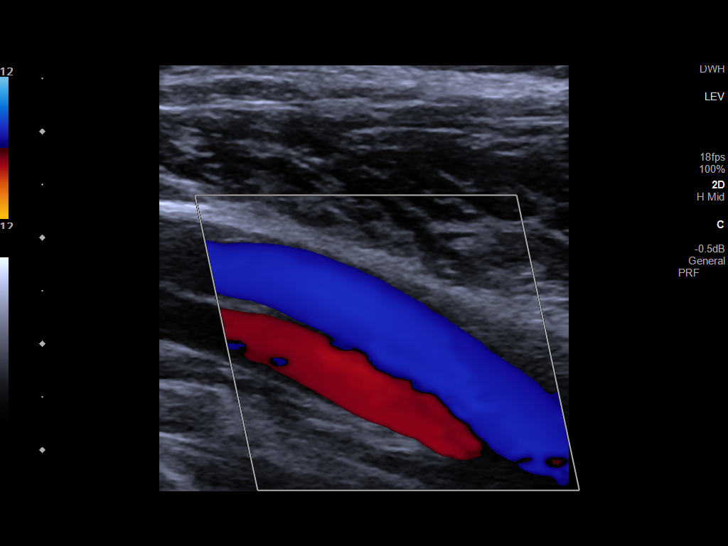
[im 31/34]
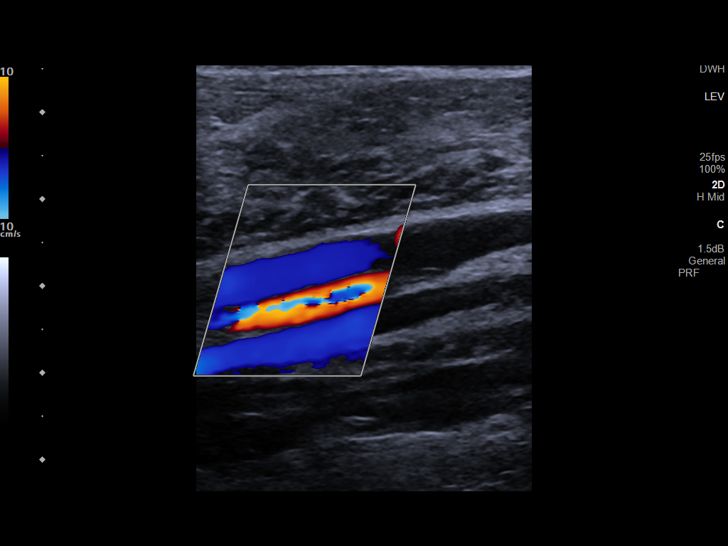
[im 34/34]
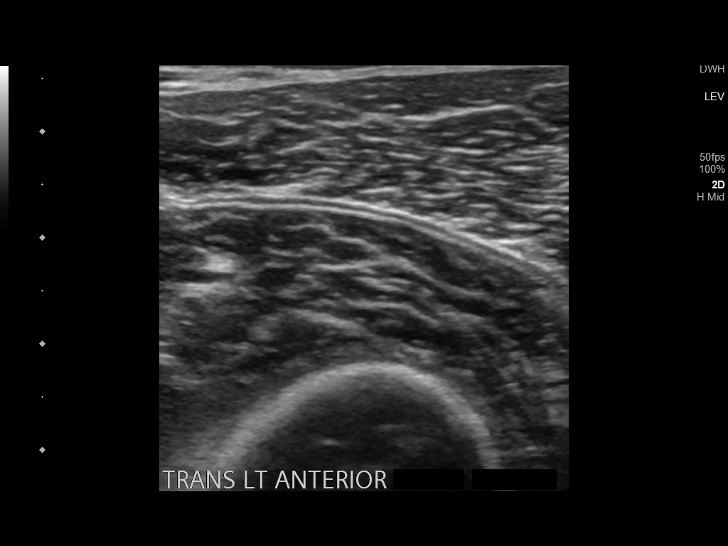

[14 of 24 positions shown; findings below may reference images not displayed]

FINDINGS: VENOUS

Normal compressibility of the common femoral, superficial femoral,
and popliteal veins, as well as the visualized calf veins.
Visualized portions of profunda femoral vein and great saphenous
vein unremarkable. No filling defects to suggest DVT on grayscale or
color Doppler imaging. Doppler waveforms show normal direction of
venous flow, normal respiratory plasticity and response to
augmentation.

Limited views of the contralateral common femoral vein are
unremarkable.

OTHER

None.

Limitations: none
IMPRESSION: Negative.

## 2023-01-09 ENCOUNTER — Other Ambulatory Visit: Payer: Self-pay

## 2023-01-09 ENCOUNTER — Ambulatory Visit
Admission: EM | Admit: 2023-01-09 | Discharge: 2023-01-09 | Disposition: A | Discharge status: Home / Self Care | Payer: 59 | Attending: Physician Assistant | Admitting: Physician Assistant

## 2023-01-09 DIAGNOSIS — J069 Acute upper respiratory infection, unspecified: Secondary | ICD-10-CM | POA: Insufficient documentation

## 2023-01-09 DIAGNOSIS — Z1152 Encounter for screening for COVID-19: Secondary | ICD-10-CM | POA: Insufficient documentation

## 2023-01-09 DIAGNOSIS — R051 Acute cough: Secondary | ICD-10-CM | POA: Diagnosis present

## 2023-01-09 LAB — SARS CORONAVIRUS 2 BY RT PCR: SARS Coronavirus 2 by RT PCR: NEGATIVE

## 2023-01-09 MED ORDER — ALBUTEROL SULFATE HFA 108 (90 BASE) MCG/ACT IN AERS: 2.0000 | INHALATION_SPRAY | Freq: Four times a day (QID) | RESPIRATORY_TRACT | 0 refills | Status: DC | PRN

## 2023-01-09 MED ORDER — FLUTICASONE PROPIONATE 50 MCG/ACT NA SUSP
1.0000 | Freq: Every day | NASAL | 0 refills | Status: DC
Start: 2023-01-09 — End: 2023-04-27

## 2023-01-09 MED ORDER — PROMETHAZINE-DM 6.25-15 MG/5ML PO SYRP: 5.0000 mL | ORAL_SOLUTION | Freq: Four times a day (QID) | ORAL | 0 refills | Status: DC | PRN

## 2023-01-09 NOTE — Discharge Instructions (Signed)
You tested negative for COVID.  I suspect you have a different virus.  Use albuterol inhaler every 4-6 hours as needed for shortness of breath and coughing fits.  Use over-the-counter medication including Mucinex, Flonase, Tylenol.  Make sure you rest and drink plenty of fluid.  Use meclizine DM for cough.  This will make you sleepy so do not drive or drink alcohol while taking it.  If your symptoms or not improving within a week return for reevaluation.  If anything worsens you should be seen immediately including chest pain, shortness of breath, worsening cough, fever, weakness.

## 2023-01-09 NOTE — ED Provider Notes (Signed)
[facility]    CSN: [id number] Arrival date & time: 01/09/23  1229      History   Chief Complaint Chief Complaint  Patient presents with   Cough    HPI Justin Woodward. is a 57 y.o. male.   Patient presents today with a 2 to 3-day history of URI symptoms including cough, fatigue, congestion.  Denies any fever, chest pain, shortness of breath, nausea, vomiting.  He has tried over-the-counter Delsym and Robitussin without improvement of symptoms.  Denies any known sick contacts.  He denies any history of allergies, asthma, COPD, smoking.  Denies any recent antibiotics or steroids.  He has had COVID in the past several years ago.  He has had initial COVID-19 vaccines but has not had more recent booster.  Denies any history of diabetes.    Past Medical History:  Diagnosis Date   Hypertension    Vertigo     Patient Active Problem List   Diagnosis Date Noted   Left leg pain 10/11/2021   Acute thigh pain, left 10/11/2021    Past Surgical History:  Procedure Laterality Date   MANDIBLE SURGERY     SHOULDER ARTHROSCOPY     WRIST FRACTURE SURGERY         Home Medications    Prior to Admission medications   Medication Sig Start Date End Date Taking? Authorizing Provider  fluticasone (FLONASE) 50 MCG/ACT nasal spray Place 1 spray into both nostrils daily. 01/09/23  Yes Jessen Siegman K, PA-C  meloxicam (MOBIC) 15 MG tablet Take by mouth. 11/10/22  Yes [provider]  potassium chloride (KLOR-CON) 10 MEQ tablet Take by mouth. 12/24/22 12/24/23 Yes [provider]  promethazine-dextromethorphan (PROMETHAZINE-DM) 6.25-15 MG/5ML syrup Take 5 mLs by mouth 4 (four) times daily as needed for cough. 01/09/23  Yes Regan Llorente K, PA-C  sildenafil (VIAGRA) 25 MG tablet TAKE 1 TO 2 TABLETS BY MOUTH AS  NEEDED 12/22/22  Yes [provider]  albuterol (VENTOLIN HFA) 108 (90 Base) MCG/ACT inhaler Inhale 2 puffs into the lungs every 6 (six) hours as needed for  wheezing or shortness of breath. 01/09/23   Tayari Yankee, Derry Skill, PA-C  fexofenadine (ALLEGRA) 30 MG tablet Take 30 mg by mouth 2 (two) times daily.    [provider]  triamterene-hydrochlorothiazide (DYAZIDE) 37.5-25 MG capsule Take 1 capsule by mouth daily.    [provider]    Family History Family History  Problem Relation Age of Onset   Diabetes Mother     Social History Social History   Tobacco Use   Smoking status: Never   Smokeless tobacco: Never  Vaping Use   Vaping Use: Never used  Substance Use Topics   Alcohol use: No   Drug use: No     Allergies   Corn-containing products and Shellfish allergy   Review of Systems Review of Systems  Constitutional:  Positive for activity change and fatigue. Negative for appetite change and fever.  HENT:  Positive for congestion. Negative for sinus pressure, sneezing and sore throat.   Respiratory:  Positive for cough. Negative for shortness of breath.   Cardiovascular:  Negative for chest pain.  Gastrointestinal:  Negative for abdominal pain, diarrhea, nausea and vomiting.  Neurological:  Negative for dizziness, light-headedness and headaches.     Physical Exam Triage Vital Signs ED Triage Vitals  Enc Vitals Group     BP 01/09/23 1306 115/83     Pulse Rate 01/09/23 1306 67  Resp 01/09/23 1306 17     Temp 01/09/23 1306 98.7 F (37.1 C)     Temp Source 01/09/23 1306 Oral     SpO2 01/09/23 1306 98 %     Weight 01/09/23 1302 210 lb (95.3 kg)     Height 01/09/23 1302 '5\' 10"'$  (1.778 m)     Head Circumference --      Peak Flow --      Pain Score 01/09/23 1301 1     Pain Loc --      Pain Edu? --      Excl. in [place]? --    No data found.  Updated Vital Signs BP 115/83 (BP Location: Left Arm)   Pulse 67   Temp 98.7 F (37.1 C) (Oral)   Resp 17   Ht '5\' 10"'$  (1.778 m)   Wt 210 lb (95.3 kg)   SpO2 98%   BMI 30.13 kg/m   Visual Acuity Right Eye Distance:   Left Eye Distance:   Bilateral Distance:     Right Eye Near:   Left Eye Near:    Bilateral Near:     Physical Exam Vitals reviewed.  Constitutional:      General: He is awake.     Appearance: Normal appearance. He is well-developed. He is not ill-appearing.     Comments: Very pleasant male appears stated age in no acute distress sitting comfortably in exam room  HENT:     Head: Normocephalic and atraumatic.     Right Ear: Tympanic membrane, ear canal and external ear normal. Tympanic membrane is not erythematous or bulging.     Left Ear: Tympanic membrane, ear canal and external ear normal. Tympanic membrane is not erythematous or bulging.     Nose: Nose normal.     Mouth/Throat:     Pharynx: Uvula midline. Posterior oropharyngeal erythema present. No oropharyngeal exudate.  Cardiovascular:     Rate and Rhythm: Normal rate and regular rhythm.     Heart sounds: Normal heart sounds, S1 normal and S2 normal. No murmur heard. Pulmonary:     Effort: Pulmonary effort is normal. No accessory muscle usage or respiratory distress.     Breath sounds: Normal breath sounds. No stridor. No wheezing, rhonchi or rales.     Comments: Clear to auscultation bilaterally Neurological:     Mental Status: He is alert.  Psychiatric:        Behavior: Behavior is cooperative.      UC Treatments / Results  Labs (all labs ordered are listed, but only abnormal results are displayed) Labs Reviewed  SARS CORONAVIRUS 2 BY RT PCR    EKG   Radiology No results found.  Procedures Procedures (including critical care time)  Medications Ordered in UC Medications - No data to display  Initial Impression / Assessment and Plan / UC Course  I have reviewed the triage vital signs and the nursing notes.  Pertinent labs & imaging results that were available during my care of the patient were reviewed by me and considered in my medical decision making (see chart for details).     Patient is well-appearing, afebrile, nontoxic, nontachycardic.   No evidence of acute infection on physical exam that would warrant initiation of antibiotics.  Flu testing was deferred as he has been symptomatic for more than 2 to 3 days and outside the window of effectiveness for Tamiflu.  COVID testing was negative.  Discussed likely viral etiology.  Patient was started on Promethazine DM for cough.  Discussed that this can be sedating and he is not to drive or drink alcohol while taking it.  Refill of albuterol was sent to pharmacy to be used for coughing fits and shortness of breath.  He can use fluticasone for nasal congestion as well as over-the-counter medications such as Mucinex.  Recommended nasal saline and sinus rinses.  He is to rest and drink plenty of fluid.  Discussed that if his symptoms or not improving within a week he is to return for reevaluation.  If he has any worsening or changing symptoms including worsening cough, shortness of breath, fever, nausea, vomiting, chest pain he needs to be seen immediately.  Strict return precautions given.  Work excuse note provided.  Final Clinical Impressions(s) / UC Diagnoses   Final diagnoses:  Viral URI with cough  Acute cough     Discharge Instructions      You tested negative for COVID.  I suspect you have a different virus.  Use albuterol inhaler every 4-6 hours as needed for shortness of breath and coughing fits.  Use over-the-counter medication including Mucinex, Flonase, Tylenol.  Make sure you rest and drink plenty of fluid.  Use meclizine DM for cough.  This will make you sleepy so do not drive or drink alcohol while taking it.  If your symptoms or not improving within a week return for reevaluation.  If anything worsens you should be seen immediately including chest pain, shortness of breath, worsening cough, fever, weakness.     ED Prescriptions     Medication Sig Dispense Auth. Provider   albuterol (VENTOLIN HFA) 108 (90 Base) MCG/ACT inhaler Inhale 2 puffs into the lungs every 6 (six)  hours as needed for wheezing or shortness of breath. 8 g Chavie Kolinski K, PA-C   promethazine-dextromethorphan (PROMETHAZINE-DM) 6.25-15 MG/5ML syrup Take 5 mLs by mouth 4 (four) times daily as needed for cough. 118 mL Jubilee Vivero K, PA-C   fluticasone (FLONASE) 50 MCG/ACT nasal spray Place 1 spray into both nostrils daily. 16 g Rayma Hegg K, PA-C      PDMP not reviewed this encounter.   Terrilee Croak, PA-C 01/09/23 1452

## 2023-01-09 NOTE — ED Triage Notes (Signed)
3 days of cough and hard to sleep. Tried Mucinex and Robitussin without relief

## 2023-01-14 ENCOUNTER — Ambulatory Visit
Admission: EM | Admit: 2023-01-14 | Discharge: 2023-01-14 | Disposition: A | Discharge status: Home / Self Care | Payer: PRIVATE HEALTH INSURANCE | Attending: Family Medicine | Admitting: Family Medicine

## 2023-01-14 ENCOUNTER — Ambulatory Visit (INDEPENDENT_AMBULATORY_CARE_PROVIDER_SITE_OTHER): Payer: PRIVATE HEALTH INSURANCE

## 2023-01-14 DIAGNOSIS — J189 Pneumonia, unspecified organism: Secondary | ICD-10-CM | POA: Diagnosis not present

## 2023-01-14 DIAGNOSIS — J208 Acute bronchitis due to other specified organisms: Secondary | ICD-10-CM

## 2023-01-14 MED ORDER — HYDROCODONE BIT-HOMATROP MBR 5-1.5 MG/5ML PO SOLN: 5.0000 mL | Freq: Four times a day (QID) | ORAL | 0 refills | Status: DC | PRN

## 2023-01-14 MED ORDER — DOXYCYCLINE HYCLATE 100 MG PO CAPS
100.0000 mg | ORAL_CAPSULE | Freq: Two times a day (BID) | ORAL | 0 refills | Status: AC
Start: 2023-01-14 — End: 2023-01-19

## 2023-01-14 MED ORDER — BENZONATATE 100 MG PO CAPS: 100.0000 mg | ORAL_CAPSULE | Freq: Three times a day (TID) | ORAL | 0 refills | Status: DC

## 2023-01-14 MED ORDER — PREDNISONE 10 MG (21) PO TBPK: ORAL_TABLET | Freq: Every day | ORAL | 0 refills | Status: DC

## 2023-01-14 NOTE — Discharge Instructions (Addendum)
Stop by the pharmacy to pick up your prescriptions.  Follow up with your primary care provider as needed.  

## 2023-01-14 NOTE — ED Triage Notes (Signed)
Pt c/o cough x10 days, was seen on 3/5 for same issue & has been using cough syrup w/o relief.

## 2023-01-14 NOTE — ED Provider Notes (Signed)
[facility]    CSN: [id number] Arrival date & time: 01/14/23  1402      History   Chief Complaint Chief Complaint  Patient presents with   Cough    HPI Justin Woodward. is a 57 y.o. male.   HPI   Justin Woodward returns for persistent cough. She was given a cough syrup and an inhaler that isn't helping. He feels fine. No fever, chest pain,shortness of breath, vomiting, diarrhea. Cough is more dry. Has some nasal congestion that goes away with Flonase.      Past Medical History:  Diagnosis Date   Hypertension    Vertigo     Patient Active Problem List   Diagnosis Date Noted   Left leg pain 10/11/2021   Acute thigh pain, left 10/11/2021    Past Surgical History:  Procedure Laterality Date   MANDIBLE SURGERY     SHOULDER ARTHROSCOPY     WRIST FRACTURE SURGERY         Home Medications    Prior to Admission medications   Medication Sig Start Date End Date Taking? Authorizing Provider  albuterol (VENTOLIN HFA) 108 (90 Base) MCG/ACT inhaler Inhale 2 puffs into the lungs every 6 (six) hours as needed for wheezing or shortness of breath. 01/09/23  Yes Raspet, Erin K, PA-C  benzonatate (TESSALON) 100 MG capsule Take 1 capsule (100 mg total) by mouth every 8 (eight) hours. 01/14/23  Yes Eliasar Hlavaty, DO  doxycycline (VIBRAMYCIN) 100 MG capsule Take 1 capsule (100 mg total) by mouth 2 (two) times daily for 5 days. 01/14/23 01/19/23 Yes Destin Kittler, DO  fexofenadine (ALLEGRA) 30 MG tablet Take 30 mg by mouth 2 (two) times daily.   Yes [provider]  fluticasone (FLONASE) 50 MCG/ACT nasal spray Place 1 spray into both nostrils daily. 01/09/23  Yes Raspet, Erin K, PA-C  HYDROcodone bit-homatropine (HYCODAN) 5-1.5 MG/5ML syrup Take 5 mLs by mouth every 6 (six) hours as needed for cough. 01/14/23  Yes Anaiya Wisinski, DO  meloxicam (MOBIC) 15 MG tablet Take by mouth. 11/10/22  Yes [provider]  potassium chloride (KLOR-CON) 10 MEQ tablet Take by  mouth. 12/24/22 12/24/23 Yes [provider]  predniSONE (STERAPRED UNI-PAK 21 TAB) 10 MG (21) TBPK tablet Take by mouth daily. Take 6 tabs by mouth daily for 1, then 5 tabs for 1 day, then 4 tabs for 1 day, then 3 tabs for 1 day, then 2 tabs for 1 day, then 1 tab for 1 day. 01/14/23  Yes Jenniferlynn Saad, DO  sildenafil (VIAGRA) 25 MG tablet TAKE 1 TO 2 TABLETS BY MOUTH AS  NEEDED 12/22/22  Yes [provider]  triamterene-hydrochlorothiazide (DYAZIDE) 37.5-25 MG capsule Take 1 capsule by mouth daily.   Yes [provider]    Family History Family History  Problem Relation Age of Onset   Diabetes Mother     Social History Social History   Tobacco Use   Smoking status: Never   Smokeless tobacco: Never  Vaping Use   Vaping Use: Never used  Substance Use Topics   Alcohol use: No   Drug use: No     Allergies   Corn-containing products and Shellfish allergy   Review of Systems Review of Systems: negative unless otherwise stated in HPI.      Physical Exam Triage Vital Signs ED Triage Vitals  Enc Vitals Group     BP 01/14/23 1410 (!) 146/93     Pulse Rate 01/14/23 1410 (!) 57  Resp 01/14/23 1410 16     Temp 01/14/23 1410 97.7 F (36.5 C)     Temp Source 01/14/23 1410 Oral     SpO2 01/14/23 1410 95 %     Weight 01/14/23 1410 210 lb (95.3 kg)     Height 01/14/23 1410 '5\' 10"'$  (1.778 m)     Head Circumference --      Peak Flow --      Pain Score 01/14/23 1409 0     Pain Loc --      Pain Edu? --      Excl. in [place]? --    No data found.  Updated Vital Signs BP (!) 146/93 (BP Location: Left Arm)   Pulse (!) 57   Temp 97.7 F (36.5 C) (Oral)   Resp 16   Ht '5\' 10"'$  (1.778 m)   Wt 95.3 kg   SpO2 95%   BMI 30.13 kg/m   Visual Acuity Right Eye Distance:   Left Eye Distance:   Bilateral Distance:    Right Eye Near:   Left Eye Near:    Bilateral Near:     Physical Exam GEN:     alert, non-ill appearing male in no distress    HENT:   mucus membranes moist, no nasal discharge EYES:   pupils equal and reactive, no scleral injection or discharge NECK:  normal ROM,  RESP:  no increased work of breathing, rhonchi on the left lower lung CVS:   regular rate and rhythm Skin:   warm and dry, no rash on visible skin    UC Treatments / Results  Labs (all labs ordered are listed, but only abnormal results are displayed) Labs Reviewed - No data to display  EKG   Radiology DG Chest 2 View  Result Date: 01/14/2023 CLINICAL DATA:  cough >1 week EXAM: CHEST - 2 VIEW COMPARISON:  None Available. FINDINGS: Mild streaky left basilar opacity. No confluent consolidation. No visible pleural effusions or pneumothorax. Cardiomediastinal silhouette is similar to prior. No acute osseous abnormality. IMPRESSION: Mild subsegmental left basilar atelectasis/scar. Otherwise, no acute abnormality. Electronically Signed   By: Margaretha Sheffield M.D.   On: 01/14/2023 15:12    Procedures Procedures (including critical care time)  Medications Ordered in UC Medications - No data to display  Initial Impression / Assessment and Plan / UC Course  I have reviewed the triage vital signs and the nursing notes.  Pertinent labs & imaging results that were available during my care of the patient were reviewed by me and considered in my medical decision making (see chart for details).       Pt is a 56 y.o. male who presents for 1-2 weeks of cough that is not improving.  Justin Woodward is  afebrile here without recent antipyretics. Satting adequately on room air. Overall pt is  non-toxic appearing, well hydrated, without respiratory distress. Pulmonary exam is remarkable for rhonchi on the left lower lung.  After shared decision making, we pursued chest x-ray. COVID was negative at his previous urgent care visit last week.  Chest xray personally reviewed by me without focal pneumonia, pleural effusion, cardiomegaly or pneumothorax however radiologist notes mild  subsegmental left basilar opacity versus atelectasis/scar.   Treat possible pneumonia with doxycycline.  Steroids, Hycodan cough syrup given for cough and allow patient to rest. Typical duration of symptoms discussed. Return and ED precautions given and patient voiced understanding.   Discussed MDM, treatment plan and plan for follow-up with patient who agrees with plan.  Final Clinical Impressions(s) / UC Diagnoses   Final diagnoses:  Community acquired pneumonia of left lower lobe of lung     Discharge Instructions      Stop by the pharmacy to pick up your prescriptions.  Follow up with your primary care provider as needed.      ED Prescriptions     Medication Sig Dispense Auth. Provider   HYDROcodone bit-homatropine (HYCODAN) 5-1.5 MG/5ML syrup Take 5 mLs by mouth every 6 (six) hours as needed for cough. 120 mL Sharyah Bostwick, DO   benzonatate (TESSALON) 100 MG capsule Take 1 capsule (100 mg total) by mouth every 8 (eight) hours. 21 capsule Aswad Wandrey, DO   predniSONE (STERAPRED UNI-PAK 21 TAB) 10 MG (21) TBPK tablet Take by mouth daily. Take 6 tabs by mouth daily for 1, then 5 tabs for 1 day, then 4 tabs for 1 day, then 3 tabs for 1 day, then 2 tabs for 1 day, then 1 tab for 1 day. 21 tablet Vasti Yagi, DO   doxycycline (VIBRAMYCIN) 100 MG capsule Take 1 capsule (100 mg total) by mouth 2 (two) times daily for 5 days. 10 capsule Shantay Sonn, DO      I have reviewed the PDMP during this encounter.   Lyndee Hensen, DO 01/16/23 1348

## 2023-04-25 ENCOUNTER — Ambulatory Visit
Admission: EM | Admit: 2023-04-25 | Discharge: 2023-04-25 | Disposition: A | Discharge status: Home / Self Care | Payer: 59 | Attending: Family Medicine | Admitting: Family Medicine

## 2023-04-25 DIAGNOSIS — R21 Rash and other nonspecific skin eruption: Secondary | ICD-10-CM | POA: Diagnosis not present

## 2023-04-25 MED ORDER — PREDNISONE 10 MG (21) PO TBPK: ORAL_TABLET | Freq: Every day | ORAL | 0 refills | Status: DC

## 2023-04-25 MED ORDER — DEXAMETHASONE SODIUM PHOSPHATE 10 MG/ML IJ SOLN
10.0000 mg | Freq: Once | INTRAMUSCULAR | Status: AC
  Administered 2023-04-25: 10 mg via INTRAMUSCULAR

## 2023-04-25 MED ORDER — TRIAMCINOLONE ACETONIDE 0.1 % EX OINT: 1.0000 | TOPICAL_OINTMENT | Freq: Two times a day (BID) | CUTANEOUS | 0 refills | Status: DC

## 2023-04-25 NOTE — Discharge Instructions (Signed)
Stop by the pharmacy to pick up your prescriptions.  Follow up with your primary care provider as needed.  

## 2023-04-25 NOTE — ED Triage Notes (Signed)
Pt presents to UC c/o rash all over body onset yesterday after work.

## 2023-04-25 NOTE — ED Provider Notes (Signed)
[facility]    CSN: [id number] Arrival date & time: 04/25/23  6578      History   Chief Complaint Chief Complaint  Patient presents with   Rash    HPI Justin Woodward. is a 57 y.o. male.   HPI  Justin Woodward presents for itching all over after work. Took some Bendaryl. The rash is red and itches.  Put on some off-repellent.  He works in the [employer].  He denies doing any yard work or being or being in the woods recently.  Denies headaches, vomiting, vision pain, weakness, blurry vision or joint pains.  Denies any bug bites, new medications/foods/lotions/detergents. Justin Woodward has otherwise been well and has no other concerns.     Past Medical History:  Diagnosis Date   Hypertension    Vertigo     Patient Active Problem List   Diagnosis Date Noted   Left leg pain 10/11/2021   Acute thigh pain, left 10/11/2021    Past Surgical History:  Procedure Laterality Date   MANDIBLE SURGERY     SHOULDER ARTHROSCOPY     WRIST FRACTURE SURGERY         Home Medications    Prior to Admission medications   Medication Sig Start Date End Date Taking? Authorizing Provider  triamcinolone ointment (KENALOG) 0.1 % Apply 1 Application topically 2 (two) times daily. 04/25/23  Yes Jyl Chico, DO  albuterol (VENTOLIN HFA) 108 (90 Base) MCG/ACT inhaler Inhale 2 puffs into the lungs every 6 (six) hours as needed for wheezing or shortness of breath. 01/09/23   Raspet, Noberto Retort, PA-C  fexofenadine (ALLEGRA) 30 MG tablet Take 30 mg by mouth 2 (two) times daily.    [provider]  fluticasone (FLONASE) 50 MCG/ACT nasal spray Place 1 spray into both nostrils daily. 01/09/23   Raspet, Noberto Retort, PA-C  HYDROcodone bit-homatropine (HYCODAN) 5-1.5 MG/5ML syrup Take 5 mLs by mouth every 6 (six) hours as needed for cough. 01/14/23   Katha Cabal, DO  meloxicam (MOBIC) 15 MG tablet Take by mouth. 11/10/22   [provider]  potassium chloride (KLOR-CON) 10 MEQ tablet Take by  mouth. 12/24/22 12/24/23  [provider]  predniSONE (STERAPRED UNI-PAK 21 TAB) 10 MG (21) TBPK tablet Take by mouth daily. Take 6 tabs by mouth daily for 1, then 5 tabs for 1 day, then 4 tabs for 1 day, then 3 tabs for 1 day, then 2 tabs for 1 day, then 1 tab for 1 day. 04/25/23   Carman Auxier, Seward Meth, DO  sildenafil (VIAGRA) 25 MG tablet TAKE 1 TO 2 TABLETS BY MOUTH AS  NEEDED 12/22/22   [provider]  triamterene-hydrochlorothiazide (DYAZIDE) 37.5-25 MG capsule Take 1 capsule by mouth daily.    [provider]    Family History Family History  Problem Relation Age of Onset   Diabetes Mother     Social History Social History   Tobacco Use   Smoking status: Never   Smokeless tobacco: Never  Vaping Use   Vaping Use: Never used  Substance Use Topics   Alcohol use: No   Drug use: No     Allergies   Corn-containing products and Shellfish allergy   Review of Systems Review of Systems :negative unless otherwise stated in HPI.      Physical Exam Triage Vital Signs ED Triage Vitals [04/25/23 0830]  Enc Vitals Group     BP      Pulse      Resp  Temp      Temp src      SpO2      Weight      Height      Head Circumference      Peak Flow      Pain Score 0     Pain Loc      Pain Edu?      Excl. in [place]?    No data found.  Updated Vital Signs BP 135/85 (BP Location: Right Arm)   Pulse (!) 55   Temp 98 F (36.7 C) (Oral)   SpO2 98%   Visual Acuity Right Eye Distance:   Left Eye Distance:   Bilateral Distance:    Right Eye Near:   Left Eye Near:    Bilateral Near:     Physical Exam  GEN: alert, well appearing male, in no acute distress  HENT: no uvular edema, no oral lesions  EYES: extra occular movements intact, no scleral injection CV: bradycardic, strong radial pulses, brisk cap refill,  RESP: no increased work of breathing, clear to ascultation bilaterally MSK: no extremity edema, no gross deformities NEURO: alert, oriented,  moves all extremities appropriately SKIN: warm and dry; diffuse maculopapular rash, blistering on finger web spaces, multiple erythematous excoriations diffusely   UC Treatments / Results  Labs (all labs ordered are listed, but only abnormal results are displayed) Labs Reviewed - No data to display  EKG   Radiology No results found.  Procedures Procedures (including critical care time)  Medications Ordered in UC Medications  dexamethasone (DECADRON) injection 10 mg (10 mg Intramuscular Given 04/25/23 0851)    Initial Impression / Assessment and Plan / UC Course  I have reviewed the triage vital signs and the nursing notes.  Pertinent labs & imaging results that were available during my care of the patient were reviewed by me and considered in my medical decision making (see chart for details).     Patient is a 57 y.o. malewho presents for diffuse pruritic erythematous rash.  Overall, patient is well-appearing and well-hydrated.  Vital signs stable.  Justin Woodward is afebrile.  History and exam concerning for contact dermatitis.  Decadron 10 mg IM given. Treat with steroid taper and ointment. Considered scabies but history does not suggest this. No oral or mucosal involvement. No sign of infection to suggest antibiotics at this time.  Return and ED precautions given. Contact dermatitis handout provided.   Reviewed expectations regarding course of current medical issues.  All questions asked were answered.  Outlined signs and symptoms indicating need for more acute intervention. Patient verbalized understanding. After Visit Summary given.   Final Clinical Impressions(s) / UC Diagnoses   Final diagnoses:  Rash     Discharge Instructions      Stop by the pharmacy to pick up your prescriptions.  Follow up with your primary care provider as needed.        ED Prescriptions     Medication Sig Dispense Auth. Provider   predniSONE (STERAPRED UNI-PAK 21 TAB) 10 MG (21) TBPK tablet  Take by mouth daily. Take 6 tabs by mouth daily for 1, then 5 tabs for 1 day, then 4 tabs for 1 day, then 3 tabs for 1 day, then 2 tabs for 1 day, then 1 tab for 1 day. 21 tablet Khloei Spiker, DO   triamcinolone ointment (KENALOG) 0.1 % Apply 1 Application topically 2 (two) times daily. 30 g Katha Cabal, DO      PDMP not reviewed this encounter.  Katha Cabal, DO 04/25/23 [phone]

## 2023-04-27 ENCOUNTER — Ambulatory Visit
Admission: EM | Admit: 2023-04-27 | Discharge: 2023-04-27 | Disposition: A | Discharge status: Home / Self Care | Payer: 59 | Attending: Emergency Medicine | Admitting: Emergency Medicine

## 2023-04-27 DIAGNOSIS — L245 Irritant contact dermatitis due to other chemical products: Secondary | ICD-10-CM | POA: Diagnosis not present

## 2023-04-27 MED ORDER — MOMETASONE FUROATE 0.1 % EX OINT: TOPICAL_OINTMENT | Freq: Every day | CUTANEOUS | 1 refills | Status: DC

## 2023-04-27 MED ORDER — HYDROXYZINE HCL 25 MG PO TABS: 25.0000 mg | ORAL_TABLET | Freq: Four times a day (QID) | ORAL | 0 refills | Status: DC | PRN

## 2023-04-27 NOTE — ED Triage Notes (Addendum)
Pt presents to UC stating rash not better. Pt was seen on 04/25/23. Pt states upper body got better but overall not clearing up. Pt states he is still currently taking prednisone & applying topical ointment given and was also given decadron in office when seen.

## 2023-04-27 NOTE — ED Provider Notes (Signed)
HPI  SUBJECTIVE:  Justin Woodward. is a 57 y.o. male who presents with persistent pruritic rash, worse on his legs, starting after spraying Off bug repellent on his body 5 days ago. Patient was seen here on 6/19 for this.  He was given Decadron here, sent home with 6-day steroid taper and triamcinolone 0.1% ointment.  He states that he is getting somewhat better, but is concerned because of the continued pruritus.  He is taking Allegra daily.  He has also tried Benadryl without improvement in his symptoms.  Symptoms are worse with heat.  No new lotions, soaps, detergents, changes medications, recent antibiotics, new foods.  No contacts with similar rash.  It itches all day.  He has a past medical history of hypertension.  PCP: [facility].  Past Medical History:  Diagnosis Date   Hypertension    Vertigo     Past Surgical History:  Procedure Laterality Date   MANDIBLE SURGERY     SHOULDER ARTHROSCOPY     WRIST FRACTURE SURGERY      Family History  Problem Relation Age of Onset   Diabetes Mother     Social History   Tobacco Use   Smoking status: Never   Smokeless tobacco: Never  Vaping Use   Vaping Use: Never used  Substance Use Topics   Alcohol use: No   Drug use: No    No current facility-administered medications for this encounter.  Current Outpatient Medications:    hydrOXYzine (ATARAX) 25 MG tablet, Take 1 tablet (25 mg total) by mouth every 6 (six) hours as needed for itching., Disp: 20 tablet, Rfl: 0   mometasone (ELOCON) 0.1 % ointment, Apply topically daily., Disp: 45 g, Rfl: 1   albuterol (VENTOLIN HFA) 108 (90 Base) MCG/ACT inhaler, Inhale 2 puffs into the lungs every 6 (six) hours as needed for wheezing or shortness of breath., Disp: 8 g, Rfl: 0   potassium chloride (KLOR-CON) 10 MEQ tablet, Take by mouth., Disp: , Rfl:    predniSONE (STERAPRED UNI-PAK 21 TAB) 10 MG (21) TBPK tablet, Take by mouth daily. Take 6 tabs by mouth daily for 1, then 5 tabs for 1  day, then 4 tabs for 1 day, then 3 tabs for 1 day, then 2 tabs for 1 day, then 1 tab for 1 day., Disp: 21 tablet, Rfl: 0   sildenafil (VIAGRA) 25 MG tablet, TAKE 1 TO 2 TABLETS BY MOUTH AS  NEEDED, Disp: , Rfl:    triamterene-hydrochlorothiazide (DYAZIDE) 37.5-25 MG capsule, Take 1 capsule by mouth daily., Disp: , Rfl:   Allergies  Allergen Reactions   Corn Oil Other (See Comments)    abdomimanl swelling   Corn-Containing Products    Shellfish Allergy Hives     ROS  As noted in HPI.   Physical Exam  BP 136/80 (BP Location: Right Arm)   Pulse 60   Temp 98.5 F (36.9 C) (Oral)   SpO2 95%   Constitutional: Well developed, well nourished, no acute distress Eyes:  EOMI, conjunctiva normal bilaterally HENT: Normocephalic, atraumatic,mucus membranes moist Respiratory: Normal inspiratory effort Cardiovascular: Normal rate GI: nondistended skin: Diffuse, blanchable, nontender excoriations of chest, arms, back, legs.  Excoriations worst on the legs.  No crusting.            Musculoskeletal: no deformities Neurologic: Alert & oriented x 3, no focal neuro deficits Psychiatric: Speech and behavior appropriate   ED Course   Medications - No data to display  No orders of the  defined types were placed in this encounter.   No results found for this or any previous visit (from the past 24 hour(s)). No results found.  ED Clinical Impression  1. Irritant contact dermatitis due to other chemical products      ED Assessment/Plan     Previous records reviewed.  As noted in HPI.  Patient already on Allegra, will discontinue this because it is not working and have him try Atarax instead.  Oatmeal baths, can try [employer] medicated cream.  Will increase the potency of his steroid but have him apply it only to his legs.  Prescribing mometasone 0.1% ointment.  There is no evidence of infection at this time.  He will need to follow-up with dermatology if this does not help  after finishing the oral steroids.  Discussed MDM, treatment plan, and plan for follow-up with patient. patient agrees with plan.   Meds ordered this encounter  Medications   hydrOXYzine (ATARAX) 25 MG tablet    Sig: Take 1 tablet (25 mg total) by mouth every 6 (six) hours as needed for itching.    Dispense:  20 tablet    Refill:  0   mometasone (ELOCON) 0.1 % ointment    Sig: Apply topically daily.    Dispense:  45 g    Refill:  1      *This clinic note was created using Scientist, clinical (histocompatibility and immunogenetics). Therefore, there may be occasional mistakes despite careful proofreading.  ?    Domenick Gong, MD 04/27/23 [phone]

## 2023-04-27 NOTE — Discharge Instructions (Addendum)
Stop the Allegra.  Try Atarax instead.  Finish the prednisone.  Try the Elocon on the areas that itch the most only.  You can also try oatmeal baths with Aveeno oatmeal (1 cup in half full bathtub) or cornstarch/baking soda (1 cup each in half full bathtub). To prevent the oatmeal from caking in pipes, place it in a tied sock before dropping it into the bathtub.  Go to www.goodrx.com to look up your medications. This will give you a list of where you can find your prescriptions at the most affordable prices. Or ask the pharmacist what the cash price is, or if they have any other discount programs available to help make your medication more affordable. This can be less expensive than what you would pay with insurance.

## 2023-05-28 ENCOUNTER — Ambulatory Visit (INDEPENDENT_AMBULATORY_CARE_PROVIDER_SITE_OTHER): Payer: 59

## 2023-05-28 ENCOUNTER — Ambulatory Visit
Admission: EM | Admit: 2023-05-28 | Discharge: 2023-05-28 | Disposition: A | Discharge status: Home / Self Care | Payer: 59 | Attending: Family Medicine | Admitting: Family Medicine

## 2023-05-28 ENCOUNTER — Encounter: Payer: Self-pay | Admitting: Emergency Medicine

## 2023-05-28 DIAGNOSIS — R079 Chest pain, unspecified: Secondary | ICD-10-CM | POA: Diagnosis not present

## 2023-05-28 DIAGNOSIS — R0789 Other chest pain: Secondary | ICD-10-CM | POA: Diagnosis present

## 2023-05-28 DIAGNOSIS — K219 Gastro-esophageal reflux disease without esophagitis: Secondary | ICD-10-CM | POA: Diagnosis present

## 2023-05-28 DIAGNOSIS — D649 Anemia, unspecified: Secondary | ICD-10-CM | POA: Diagnosis not present

## 2023-05-28 LAB — CBC WITH DIFFERENTIAL/PLATELET
Abs Immature Granulocytes: 0.05 10*3/uL (ref 0.00–0.07)
Basophils Absolute: 0 10*3/uL (ref 0.0–0.1)
Basophils Relative: 1 %
Eosinophils Absolute: 0.2 10*3/uL (ref 0.0–0.5)
Eosinophils Relative: 3 %
HCT: 35 % — ABNORMAL LOW (ref 39.0–52.0)
Hemoglobin: 11.8 g/dL — ABNORMAL LOW (ref 13.0–17.0)
Immature Granulocytes: 1 %
Lymphocytes Relative: 23 %
Lymphs Abs: 1.6 10*3/uL (ref 0.7–4.0)
MCH: 28.9 pg (ref 26.0–34.0)
MCHC: 33.7 g/dL (ref 30.0–36.0)
MCV: 85.6 fL (ref 80.0–100.0)
Monocytes Absolute: 0.4 10*3/uL (ref 0.1–1.0)
Monocytes Relative: 5 %
Neutro Abs: 4.8 10*3/uL (ref 1.7–7.7)
Neutrophils Relative %: 67 %
Platelets: 266 10*3/uL (ref 150–400)
RBC: 4.09 MIL/uL — ABNORMAL LOW (ref 4.22–5.81)
RDW: 13.3 % (ref 11.5–15.5)
WBC: 7.1 10*3/uL (ref 4.0–10.5)
nRBC: 0 % (ref 0.0–0.2)

## 2023-05-28 LAB — BASIC METABOLIC PANEL
Anion gap: 9 (ref 5–15)
BUN: 23 mg/dL — ABNORMAL HIGH (ref 6–20)
CO2: 27 mmol/L (ref 22–32)
Calcium: 9.1 mg/dL (ref 8.9–10.3)
Chloride: 104 mmol/L (ref 98–111)
Creatinine, Ser: 1.22 mg/dL (ref 0.61–1.24)
GFR, Estimated: >60 mL/min (ref >60)
Glucose, Bld: 107 mg/dL — ABNORMAL HIGH (ref 70–99)
Potassium: 3.4 mmol/L — ABNORMAL LOW (ref 3.5–5.1)
Sodium: 140 mmol/L (ref 135–145)

## 2023-05-28 LAB — TROPONIN I (HIGH SENSITIVITY): Troponin I (High Sensitivity): 9 ng/L (ref <18)

## 2023-05-28 MED ORDER — LIDOCAINE VISCOUS HCL 2 % MT SOLN
15.0000 mL | Freq: Once | OROMUCOSAL | Status: AC
  Administered 2023-05-28: 15 mL via OROMUCOSAL

## 2023-05-28 MED ORDER — PANTOPRAZOLE SODIUM 40 MG PO TBEC: 40.0000 mg | DELAYED_RELEASE_TABLET | Freq: Every day | ORAL | 0 refills | Status: DC

## 2023-05-28 MED ORDER — ALUM & MAG HYDROXIDE-SIMETH 200-200-20 MG/5ML PO SUSP
30.0000 mL | Freq: Once | ORAL | Status: AC
  Administered 2023-05-28: 30 mL via ORAL

## 2023-05-28 NOTE — Discharge Instructions (Addendum)
Follow up with primary care provider to discuss anemia and possible need to repeat colonoscopy.   Take the pantoprazole for acid reflux an hour before or after eating.

## 2023-05-28 NOTE — ED Triage Notes (Addendum)
Pt presents with central chest pain, burning, pressure and tightness x 3-4 weeks. The pain occurs mainly at night while sleeping. Pt has tried acid reflux medication with no relief. Pt reports testing positive for Covid last week.

## 2023-05-28 NOTE — ED Provider Notes (Signed)
[facility]    CSN: [id number] Arrival date & time: 05/28/23  1336      History   Chief Complaint Chief Complaint  Patient presents with   Chest Pain    HPI Justin Woodward. is a 57 y.o. male.   HPI  Justin Woodward here for chest pain/discomfort. Reports 3-4 weeks has central chest pain that wakes him up at night. Has burning sensation with pressure. He eats no later than 6 PM and goes to bed around 10-11 PM.  He has the pain intermittently.  He has burning pressure " like something is pressing on you."   Never had similar sx before. Took some Tums but symptoms have not going away.    Patient Denies: Nausea, Vomiting, Diaphoresis, Shortness of breath, Pleuritic pain, Leg swelling, Syncope, Recent immobility, Wheezing, Hemoptysis, Trauma, and Fever.  Endorses slight cough after having COVID.  Pt reports no history of PE, DVT, DM, cancer, recent surgery, travel. Has HTN.  Tobacco use : never     Past Medical History:  Diagnosis Date   Hypertension    Vertigo     Patient Active Problem List   Diagnosis Date Noted   Left leg pain 10/11/2021   Acute thigh pain, left 10/11/2021    Past Surgical History:  Procedure Laterality Date   MANDIBLE SURGERY     SHOULDER ARTHROSCOPY     WRIST FRACTURE SURGERY         Home Medications    Prior to Admission medications   Medication Sig Start Date End Date Taking? Authorizing Provider  pantoprazole (PROTONIX) 40 MG tablet Take 1 tablet (40 mg total) by mouth daily. 05/28/23  Yes Desiree Fleming, DO  albuterol (VENTOLIN HFA) 108 (90 Base) MCG/ACT inhaler Inhale 2 puffs into the lungs every 6 (six) hours as needed for wheezing or shortness of breath. 01/09/23   Raspet, Noberto Retort, PA-C  sildenafil (VIAGRA) 25 MG tablet TAKE 1 TO 2 TABLETS BY MOUTH AS  NEEDED 12/22/22   [provider]  triamterene-hydrochlorothiazide (DYAZIDE) 37.5-25 MG capsule Take 1 capsule by mouth daily.    [provider]    Family  History Family History  Problem Relation Age of Onset   Diabetes Mother     Social History Social History   Tobacco Use   Smoking status: Never   Smokeless tobacco: Never  Vaping Use   Vaping status: Never Used  Substance Use Topics   Alcohol use: No   Drug use: No     Allergies   Corn oil, Corn-containing products, and Shellfish allergy   Review of Systems Review of Systems :negative unless otherwise stated in HPI.      Physical Exam Triage Vital Signs ED Triage Vitals  Encounter Vitals Group     BP      Systolic BP Percentile      Diastolic BP Percentile      Pulse      Resp      Temp      Temp src      SpO2      Weight      Height      Head Circumference      Peak Flow      Pain Score      Pain Loc      Pain Education      Exclude from Growth Chart    No data found.  Updated Vital Signs BP 127/85 (BP Location: Left Arm)   Pulse  67   Temp 98.4 F (36.9 C) (Oral)   Resp 16   SpO2 97%   Visual Acuity Right Eye Distance:   Left Eye Distance:   Bilateral Distance:    Right Eye Near:   Left Eye Near:    Bilateral Near:     Physical Exam  GEN: well appearing male, in no acute distress   CV: regular rate and rhythm,  no murmurs, rubs or gallops  appreciated,, no JVP   CHEST WALL: non-tender to palpation  RESP: no increased work of breathing, clear to ascultation bilaterally ABD: Bowel sounds present. Soft, mild epigastric tenderness, non-distended. No rebound, no guarding   MSK: no edema SKIN: warm, dry, no rash on visible skin NEURO: alert, moves all extremities appropriately PSYCH: Normal affect, appropriate speech and behavior   UC Treatments / Results  Labs (all labs ordered are listed, but only abnormal results are displayed) Labs Reviewed  CBC WITH DIFFERENTIAL/PLATELET - Abnormal; Notable for the following components:      Result Value   RBC 4.09 (*)    Hemoglobin 11.8 (*)    HCT 35.0 (*)    All other components within normal  limits  BASIC METABOLIC PANEL - Abnormal; Notable for the following components:   Potassium 3.4 (*)    Glucose, Bld 107 (*)    BUN 23 (*)    All other components within normal limits  TROPONIN I (HIGH SENSITIVITY)    EKG  See my EKG interpretation in the MDM section  Radiology DG Chest 2 View  Result Date: 05/28/2023 CLINICAL DATA:  Chest pain. EXAM: CHEST - 2 VIEW COMPARISON:  January 14, 2023. FINDINGS: The heart size and mediastinal contours are within normal limits. Both lungs are clear. The visualized skeletal structures are unremarkable. IMPRESSION: No active cardiopulmonary disease. Electronically Signed   By: Lupita Raider M.D.   On: 05/28/2023 15:05     Procedures Procedures (including critical care time)  Medications Ordered in UC Medications  alum & mag hydroxide-simeth (MAALOX/MYLANTA) 200-200-20 MG/5ML suspension 30 mL (30 mLs Oral Given 05/28/23 1443)  lidocaine (XYLOCAINE) 2 % viscous mouth solution 15 mL (15 mLs Mouth/Throat Given 05/28/23 1443)    Initial Impression / Assessment and Plan / UC Course  I have reviewed the triage vital signs and the nursing notes.  Pertinent labs & imaging results that were available during my care of the patient were reviewed by me and considered in my medical decision making (see chart for details).       Patient is a 57 y.o. male with history of HTN  who presents with 3-4 weeks of central chest pain.  Overall, patient is well-appearing, well-hydrated and in no acute distress.  He was initially hypertensive but on recheck moments later he was normotensive.  Satting well on room air.  Differential diagnosis includes, but is not limited to, ACS, aortic dissection, pulmonary embolism, cardiac tamponade, pneumothorax, pneumonia, pericarditis/myocarditis, GI-related causes including esophagitis/gastritis, and musculoskeletal chest wall pain   Justin Woodward has no history of PE.  Unable to [facility] pt out due to age but not likely PE otherwise.   EKG obtained and is without ST elevations or concern for ischemia, NSR compared to 07/05/20 EKG; personally interpreted by me.  CBC showing no inflammation or infection.  BMP with mild hypokalemia, potassium 3.4 and mild uremia (BUN 23).    Normocytic anemia: Today hemoglobin 11.8 down from previous cbc 13.5 12/21/22 at [facility] . Colonoscopy performed 05/17/2015 colonoscopy notes show he  had no polpys were removed and due for repeat in 10 years.   Discussed MDM, treatment plan and plan for follow-up with patient/parent who agrees with plan.   Chest x-ray did not show bacterial pneumonia, pleural effusions or pneumothorax, interpreted by me. Initial hsTrop was negative.  I do not think his chest pain is cardiopulmonary related. No other anxiety signs or symptoms. Exam not concerning for costochondritis or MSK as pain is not reproducible, on exam.  Has GERD symptoms and had relief with G I cocktail. Treat GER with Protonix.          Final Clinical Impressions(s) / UC Diagnoses   Final diagnoses:  Atypical chest pain  Normocytic anemia  Gastroesophageal reflux disease, unspecified whether esophagitis present     Discharge Instructions      Follow up with primary care provider to discuss anemia and possible need to repeat colonoscopy.   Take the pantoprazole for acid reflux an hour before or after eating.         ED Prescriptions     Medication Sig Dispense Auth. Provider   pantoprazole (PROTONIX) 40 MG tablet Take 1 tablet (40 mg total) by mouth daily. 30 tablet Katha Cabal, DO      PDMP not reviewed this encounter.   Katha Cabal, DO 05/28/23 1944

## 2023-11-02 ENCOUNTER — Encounter: Payer: Self-pay | Admitting: Emergency Medicine

## 2023-11-02 ENCOUNTER — Ambulatory Visit
Admission: EM | Admit: 2023-11-02 | Discharge: 2023-11-02 | Disposition: A | Discharge status: Home / Self Care | Payer: 59 | Attending: Family Medicine | Admitting: Family Medicine

## 2023-11-02 DIAGNOSIS — J069 Acute upper respiratory infection, unspecified: Secondary | ICD-10-CM | POA: Diagnosis not present

## 2023-11-02 LAB — SARS CORONAVIRUS 2 BY RT PCR: SARS Coronavirus 2 by RT PCR: NEGATIVE

## 2023-11-02 LAB — GROUP A STREP BY PCR: Group A Strep by PCR: NOT DETECTED

## 2023-11-02 MED ORDER — HYDROCOD POLI-CHLORPHE POLI ER 10-8 MG/5ML PO SUER: 5.0000 mL | Freq: Two times a day (BID) | ORAL | 0 refills | Status: DC | PRN

## 2023-11-02 MED ORDER — BENZONATATE 100 MG PO CAPS: 100.0000 mg | ORAL_CAPSULE | Freq: Three times a day (TID) | ORAL | 0 refills | Status: DC

## 2023-11-02 NOTE — ED Provider Notes (Signed)
[facility]    CSN: [id number] Arrival date & time: 11/02/23  1014      History   Chief Complaint Chief Complaint  Patient presents with   Cough   Sore Throat    HPI Justin Lei. is a 57 y.o. male.   HPI  History obtained from the patient. Justin Woodward presents for cough started 3-4 days ago.  Cough gets worse at night. Has scratchy sore throat, nasal congestion.  No vomiting, diarrhea, fever, abdominal pain.  Tried Mucinex, cough drops, throat spray, AlkaSeltzer cold plus without relief. No known sick contacts.       Past Medical History:  Diagnosis Date   Hypertension    Vertigo     Patient Active Problem List   Diagnosis Date Noted   Left leg pain 10/11/2021   Acute thigh pain, left 10/11/2021    Past Surgical History:  Procedure Laterality Date   MANDIBLE SURGERY     SHOULDER ARTHROSCOPY     WRIST FRACTURE SURGERY         Home Medications    Prior to Admission medications   Medication Sig Start Date End Date Taking? Authorizing Provider  benzonatate (TESSALON) 100 MG capsule Take 1 capsule (100 mg total) by mouth every 8 (eight) hours. 11/02/23  Yes Sidrah Harden, DO  chlorpheniramine-HYDROcodone (TUSSIONEX) 10-8 MG/5ML Take 5 mLs by mouth every 12 (twelve) hours as needed. 11/02/23  Yes Nohely Whitehorn, DO  pantoprazole (PROTONIX) 40 MG tablet Take 1 tablet (40 mg total) by mouth daily. 05/28/23  Yes Kye Hedden, DO  triamterene-hydrochlorothiazide (DYAZIDE) 37.5-25 MG capsule Take 1 capsule by mouth daily.   Yes [provider]  albuterol (VENTOLIN HFA) 108 (90 Base) MCG/ACT inhaler Inhale 2 puffs into the lungs every 6 (six) hours as needed for wheezing or shortness of breath. 01/09/23   Raspet, Noberto Retort, PA-C  meloxicam (MOBIC) 15 MG tablet Take 15 mg by mouth daily.    [provider]  sildenafil (VIAGRA) 25 MG tablet TAKE 1 TO 2 TABLETS BY MOUTH AS  NEEDED 12/22/22   [provider]    Family  History Family History  Problem Relation Age of Onset   Diabetes Mother     Social History Social History   Tobacco Use   Smoking status: Never   Smokeless tobacco: Never  Vaping Use   Vaping status: Never Used  Substance Use Topics   Alcohol use: No   Drug use: No     Allergies   Corn oil, Corn-containing products, and Shellfish allergy   Review of Systems Review of Systems: negative unless otherwise stated in HPI.      Physical Exam Triage Vital Signs ED Triage Vitals  Encounter Vitals Group     BP 11/02/23 1127 113/76     Systolic BP Percentile --      Diastolic BP Percentile --      Pulse Rate 11/02/23 1127 64     Resp 11/02/23 1127 16     Temp 11/02/23 1127 98 F (36.7 C)     Temp Source 11/02/23 1127 Oral     SpO2 11/02/23 1127 95 %     Weight 11/02/23 1125 210 lb (95.3 kg)     Height 11/02/23 1125 5\' 10"  (1.778 m)     Head Circumference --      Peak Flow --      Pain Score 11/02/23 1124 0     Pain Loc --  Pain Education --      Exclude from Growth Chart --    No data found.  Updated Vital Signs BP 113/76 (BP Location: Left Arm)   Pulse 64   Temp 98 F (36.7 C) (Oral)   Resp 16   Ht 5\' 10"  (1.778 m)   Wt 95.3 kg   SpO2 95%   BMI 30.13 kg/m   Visual Acuity Right Eye Distance:   Left Eye Distance:   Bilateral Distance:    Right Eye Near:   Left Eye Near:    Bilateral Near:     Physical Exam GEN:     alert, non-toxic appearing male in no distress    HENT:  mucus membranes moist, oropharyngeal without lesions or erythema, no tonsillar hypertrophy or exudates,  no nasal discharge, no frontal maxillary sinus tenderness EYES:   no scleral injection or discharge RESP:  no increased work of breathing, clear to auscultation bilaterally CVS:   regular rate and rhythm Skin:   warm and dry    UC Treatments / Results  Labs (all labs ordered are listed, but only abnormal results are displayed) Labs Reviewed  GROUP A STREP BY PCR   SARS CORONAVIRUS 2 BY RT PCR    EKG   Radiology No results found.  Procedures Procedures (including critical care time)  Medications Ordered in UC Medications - No data to display  Initial Impression / Assessment and Plan / UC Course  I have reviewed the triage vital signs and the nursing notes.  Pertinent labs & imaging results that were available during my care of the patient were reviewed by me and considered in my medical decision making (see chart for details).       Pt is a 57 y.o. male who presents for 3-4 days of respiratory symptoms. Justin Woodward is afebrile here without recent antipyretics. Satting well on room air. Overall pt is non-toxic appearing, well hydrated, without respiratory distress. Pulmonary exam is unremarkable.  COVID testing obtained and was negative.  Strep PCR is negative.    History and exam most consistent with viral respiratory illness. Discussed symptomatic treatment.  Explained lack of efficacy of antibiotics in viral disease.  Typical duration of symptoms discussed.  Tussionex and Tessalon Perles for cough.  Return and ED precautions given and voiced understanding. Discussed MDM, treatment plan and plan for follow-up with patient who agrees with plan.     Final Clinical Impressions(s) / UC Diagnoses   Final diagnoses:  Viral URI with cough     Discharge Instructions      We will contact you if your COVID test is positive.  Please quarantine while you wait for the results.  If your test is negative you may resume normal activities.  If your test is positive, quarantine until you are without a fever for at least 24 hours without fever-lowering (Tylenol/Motrin) medications.    If your were prescribed medication, stop by the pharmacy to pick them up.   You can take Tylenol and/or Ibuprofen as needed for fever reduction and pain relief.    For cough: take the prescription cough meds. You can use a humidifier for chest congestion and cough.  If  you don't have a humidifier, you can sit in the bathroom with the hot shower running.      For sore throat: try warm salt water gargles, Mucinex sore throat cough drops or cepacol lozenges, throat spray, warm tea or water with lemon/honey, popsicles or ice, or OTC cold relief medicine  for throat discomfort. You can also purchase chloraseptic spray at the pharmacy or dollar store.   For congestion: take a daily anti-histamine like Zyrtec, Claritin, and a oral decongestant, such as pseudoephedrine.  You can also use Flonase 1-2 sprays in each nostril daily. Afrin is also a good option, if you do not have high blood pressure.    It is important to stay hydrated: drink plenty of fluids (water, gatorade/powerade/pedialyte, juices, or teas) to keep your throat moisturized and help further relieve irritation/discomfort.    Return or go to the Emergency Department if symptoms worsen or do not improve in the next few days      ED Prescriptions     Medication Sig Dispense Auth. Provider   benzonatate (TESSALON) 100 MG capsule Take 1 capsule (100 mg total) by mouth every 8 (eight) hours. 21 capsule Karmina Zufall, DO   chlorpheniramine-HYDROcodone (TUSSIONEX) 10-8 MG/5ML Take 5 mLs by mouth every 12 (twelve) hours as needed. 115 mL Kenna Seward, Seward Meth, DO      I have reviewed the PDMP during this encounter.   Katha Cabal, DO 11/02/23 1251

## 2023-11-02 NOTE — Discharge Instructions (Signed)
We will contact you if your COVID test is positive.  Please quarantine while you wait for the results.  If your test is negative you may resume normal activities.  If your test is positive, quarantine until you are without a fever for at least 24 hours without fever-lowering (Tylenol/Motrin) medications.    If your were prescribed medication, stop by the pharmacy to pick them up.   You can take Tylenol and/or Ibuprofen as needed for fever reduction and pain relief.    For cough: take the prescription cough meds. You can use a humidifier for chest congestion and cough.  If you don't have a humidifier, you can sit in the bathroom with the hot shower running.      For sore throat: try warm salt water gargles, Mucinex sore throat cough drops or cepacol lozenges, throat spray, warm tea or water with lemon/honey, popsicles or ice, or OTC cold relief medicine for throat discomfort. You can also purchase chloraseptic spray at the pharmacy or dollar store.   For congestion: take a daily anti-histamine like Zyrtec, Claritin, and a oral decongestant, such as pseudoephedrine.  You can also use Flonase 1-2 sprays in each nostril daily. Afrin is also a good option, if you do not have high blood pressure.    It is important to stay hydrated: drink plenty of fluids (water, gatorade/powerade/pedialyte, juices, or teas) to keep your throat moisturized and help further relieve irritation/discomfort.    Return or go to the Emergency Department if symptoms worsen or do not improve in the next few days

## 2023-11-02 NOTE — ED Triage Notes (Signed)
Patient reports sore throat and cough that started 4 days ago.  Patient denies fevers.

## 2023-11-14 ENCOUNTER — Ambulatory Visit: Payer: 59

## 2023-11-24 ENCOUNTER — Ambulatory Visit
Admission: EM | Admit: 2023-11-24 | Discharge: 2023-11-24 | Disposition: A | Discharge status: Home / Self Care | Payer: 59

## 2023-11-24 ENCOUNTER — Encounter: Payer: Self-pay | Admitting: Emergency Medicine

## 2023-11-24 DIAGNOSIS — M545 Low back pain, unspecified: Secondary | ICD-10-CM

## 2023-11-24 DIAGNOSIS — G8929 Other chronic pain: Secondary | ICD-10-CM | POA: Diagnosis not present

## 2023-11-24 MED ORDER — KETOROLAC TROMETHAMINE 30 MG/ML IJ SOLN
60.0000 mg | Freq: Once | INTRAMUSCULAR | Status: AC
  Administered 2023-11-24: 60 mg via INTRAMUSCULAR

## 2023-11-24 NOTE — ED Provider Notes (Signed)
[facility]    CSN: [id number] Arrival date & time: 11/24/23  1144      History   Chief Complaint Chief Complaint  Patient presents with   Back Pain    HPI Justin Woodward. is a 58 y.o. male.   58 year old male presents with flare up of chronic mid lower back pain. He has had lower back pain for many years and has seen various Orthopedic and Neursurgeons with minimal relief/help. He was taking Meloxicam daily which seemed to help some but recently had a EGD which showed gastritis and ulcer due to NSAID use. He stopped the Meloxicam about 1 week ago and that is when the pain started getting worse again.   The history is provided by the patient.  Back Pain   Past Medical History:  Diagnosis Date   Hypertension    Vertigo     Patient Active Problem List   Diagnosis Date Noted   Left leg pain 10/11/2021   Acute thigh pain, left 10/11/2021    Past Surgical History:  Procedure Laterality Date   MANDIBLE SURGERY     SHOULDER ARTHROSCOPY     WRIST FRACTURE SURGERY         Home Medications    Prior to Admission medications   Medication Sig Start Date End Date Taking? Authorizing Provider  omeprazole (PRILOSEC) 40 MG capsule Take 40 mg by mouth daily. 11/12/23 11/11/24 Yes [provider]  triamterene-hydrochlorothiazide (DYAZIDE) 37.5-25 MG capsule Take 1 capsule by mouth daily.   Yes [provider]  sildenafil (VIAGRA) 25 MG tablet TAKE 1 TO 2 TABLETS BY MOUTH AS  NEEDED 12/22/22   [provider]    Family History Family History  Problem Relation Age of Onset   Diabetes Mother     Social History Social History   Tobacco Use   Smoking status: Never   Smokeless tobacco: Never  Vaping Use   Vaping status: Never Used  Substance Use Topics   Alcohol use: No   Drug use: No     Allergies   Corn oil, Corn-containing products, and Shellfish allergy   Review of Systems Review of Systems  Musculoskeletal:  Positive  for back pain.     Physical Exam Triage Vital Signs ED Triage Vitals  Encounter Vitals Group     BP 11/24/23 1227 (!) 143/94     Systolic BP Percentile --      Diastolic BP Percentile --      Pulse Rate 11/24/23 1227 62     Resp 11/24/23 1227 15     Temp 11/24/23 1227 98.5 F (36.9 C)     Temp Source 11/24/23 1227 Oral     SpO2 11/24/23 1227 95 %     Weight 11/24/23 1225 210 lb (95.3 kg)     Height 11/24/23 1225 5\' 10"  (1.778 m)     Head Circumference --      Peak Flow --      Pain Score 11/24/23 1225 8     Pain Loc --      Pain Education --      Exclude from Growth Chart --    No data found.  Updated Vital Signs BP (!) 143/94 (BP Location: Right Arm)   Pulse 62   Temp 98.5 F (36.9 C) (Oral)   Resp 15   Ht 5\' 10"  (1.778 m)   Wt 210 lb (95.3 kg)   SpO2 95%   BMI 30.13 kg/m  Visual Acuity Right Eye Distance:   Left Eye Distance:   Bilateral Distance:    Right Eye Near:   Left Eye Near:    Bilateral Near:     Physical Exam Musculoskeletal:     Cervical back: Normal.     Thoracic back: Normal.     Lumbar back: No swelling, spasms or tenderness. Decreased range of motion. Negative right straight leg raise test and negative left straight leg raise test.       Back:      UC Treatments / Results  Labs (all labs ordered are listed, but only abnormal results are displayed) Labs Reviewed - No data to display  EKG   Radiology No results found.  Procedures Procedures (including critical care time)  Medications Ordered in UC Medications  ketorolac (TORADOL) 30 MG/ML injection 60 mg (has no administration in time range)    Initial Impression / Assessment and Plan / UC Course  I have reviewed the triage vital signs and the nursing notes.  Pertinent labs & imaging results that were available during my care of the patient were reviewed by me and considered in my medical decision making (see chart for details).     *** Final Clinical  Impressions(s) / UC Diagnoses   Final diagnoses:  Chronic midline low back pain without sciatica     Discharge Instructions       We gave you a shot of Toradol 60mg  today to help with pain and inflammation. Since you are unable to take any oral NSAIDs or Prednisone and other medications have not been helpful, recommend continue with dry needling to help with pain. Also may contact [facility] or [facility] in [place] to schedule appointment for further evaluation.     ED Prescriptions   None    PDMP not reviewed this encounter.

## 2023-11-24 NOTE — ED Triage Notes (Signed)
Patient c/o lower mid back pain that started 7 days ago.  Patient denies injury or fall.

## 2023-11-24 NOTE — Discharge Instructions (Signed)
We gave you a shot of Toradol 60mg  today to help with pain and inflammation. Since you are unable to take any oral NSAIDs or Prednisone and other medications have not been helpful, recommend continue with dry needling to help with pain. Also may contact [facility] or [facility] in [place] to schedule appointment for further evaluation.

## 2023-12-07 ENCOUNTER — Ambulatory Visit
Admission: EM | Admit: 2023-12-07 | Discharge: 2023-12-07 | Disposition: A | Discharge status: Home / Self Care | Payer: 59 | Attending: Physician Assistant | Admitting: Physician Assistant

## 2023-12-07 ENCOUNTER — Encounter: Payer: Self-pay | Admitting: Emergency Medicine

## 2023-12-07 DIAGNOSIS — R051 Acute cough: Secondary | ICD-10-CM | POA: Diagnosis present

## 2023-12-07 DIAGNOSIS — G8929 Other chronic pain: Secondary | ICD-10-CM | POA: Diagnosis present

## 2023-12-07 DIAGNOSIS — R509 Fever, unspecified: Secondary | ICD-10-CM | POA: Insufficient documentation

## 2023-12-07 DIAGNOSIS — M545 Low back pain, unspecified: Secondary | ICD-10-CM | POA: Diagnosis present

## 2023-12-07 DIAGNOSIS — J101 Influenza due to other identified influenza virus with other respiratory manifestations: Secondary | ICD-10-CM | POA: Insufficient documentation

## 2023-12-07 LAB — RESP PANEL BY RT-PCR (FLU A&B, COVID) ARPGX2
Influenza A by PCR: POSITIVE — AB
Influenza B by PCR: NEGATIVE
SARS Coronavirus 2 by RT PCR: NEGATIVE

## 2023-12-07 MED ORDER — KETOROLAC TROMETHAMINE 30 MG/ML IJ SOLN
30.0000 mg | Freq: Once | INTRAMUSCULAR | Status: AC
  Administered 2023-12-07: 30 mg via INTRAMUSCULAR

## 2023-12-07 MED ORDER — PROMETHAZINE-DM 6.25-15 MG/5ML PO SYRP: 5.0000 mL | ORAL_SOLUTION | Freq: Four times a day (QID) | ORAL | 0 refills | Status: DC | PRN

## 2023-12-07 MED ORDER — OSELTAMIVIR PHOSPHATE 6 MG/ML PO SUSR: 75.0000 mg | Freq: Two times a day (BID) | ORAL | 0 refills | Status: AC

## 2023-12-07 NOTE — ED Provider Notes (Signed)
[facility]    CSN: [id number] Arrival date & time: 12/07/23  1451      History   Chief Complaint Chief Complaint  Patient presents with   Cough   Fever    HPI Justin Woodward. is a 58 y.o. male presenting for fever, fatigue, cough, congestion, body aches, sore throat, slight shortness of breath x 2 days.  Also reports pain in his lower back.  This is a chronic issue but has recently worsened.  He was seen here on January 18 for his chronic back pain and given it Toradol injection.  He states that it was very helpful and he has an appointment with orthopedics in about 2 weeks.  HPI  Past Medical History:  Diagnosis Date   Hypertension    Vertigo     Patient Active Problem List   Diagnosis Date Noted   Left leg pain 10/11/2021   Acute thigh pain, left 10/11/2021    Past Surgical History:  Procedure Laterality Date   MANDIBLE SURGERY     SHOULDER ARTHROSCOPY     WRIST FRACTURE SURGERY         Home Medications    Prior to Admission medications   Medication Sig Start Date End Date Taking? Authorizing Provider  omeprazole (PRILOSEC) 40 MG capsule Take 40 mg by mouth daily. 11/12/23 11/11/24 Yes [provider]  oseltamivir (TAMIFLU) 6 MG/ML SUSR suspension Take 12.5 mLs (75 mg total) by mouth 2 (two) times daily for 5 days. 12/07/23 12/12/23 Yes Shirlee Latch, PA-C  promethazine-dextromethorphan (PROMETHAZINE-DM) 6.25-15 MG/5ML syrup Take 5 mLs by mouth 4 (four) times daily as needed. 12/07/23  Yes Eusebio Friendly B, PA-C  sildenafil (VIAGRA) 25 MG tablet TAKE 1 TO 2 TABLETS BY MOUTH AS  NEEDED 12/22/22  Yes [provider]  triamterene-hydrochlorothiazide (DYAZIDE) 37.5-25 MG capsule Take 1 capsule by mouth daily.   Yes [provider]    Family History Family History  Problem Relation Age of Onset   Diabetes Mother     Social History Social History   Tobacco Use   Smoking status: Never   Smokeless tobacco: Never  Vaping  Use   Vaping status: Never Used  Substance Use Topics   Alcohol use: No   Drug use: No     Allergies   Corn oil, Corn-containing products, and Shellfish allergy   Review of Systems Review of Systems  Constitutional:  Positive for fatigue and fever.  HENT:  Positive for congestion, rhinorrhea and sore throat. Negative for sinus pressure and sinus pain.   Respiratory:  Positive for cough and shortness of breath.   Cardiovascular:  Negative for chest pain.  Gastrointestinal:  Negative for abdominal pain, diarrhea, nausea and vomiting.  Musculoskeletal:  Positive for back pain (chronic). Negative for myalgias.  Neurological:  Positive for headaches. Negative for weakness, light-headedness and numbness.  Hematological:  Negative for adenopathy.     Physical Exam Triage Vital Signs ED Triage Vitals  Encounter Vitals Group     BP 12/07/23 1553 139/87     Systolic BP Percentile --      Diastolic BP Percentile --      Pulse Rate 12/07/23 1553 64     Resp 12/07/23 1553 16     Temp 12/07/23 1553 (!) 101.6 F (38.7 C)     Temp Source 12/07/23 1553 Oral     SpO2 12/07/23 1553 100 %     Weight 12/07/23 1552 210 lb 1.6 oz (95.3 kg)  Height 12/07/23 1552 5\' 10"  (1.778 m)     Head Circumference --      Peak Flow --      Pain Score 12/07/23 1551 7     Pain Loc --      Pain Education --      Exclude from Growth Chart --    No data found.  Updated Vital Signs BP 139/87 (BP Location: Left Arm)   Pulse 64   Temp (!) 101.6 F (38.7 C) (Oral)   Resp 16   Ht 5\' 10"  (1.778 m)   Wt 210 lb 1.6 oz (95.3 kg)   SpO2 100%   BMI 30.15 kg/m      Physical Exam Vitals and nursing note reviewed.  Constitutional:      General: He is not in acute distress.    Appearance: Normal appearance. He is well-developed. He is ill-appearing.  HENT:     Head: Normocephalic and atraumatic.     Nose: Congestion present.     Mouth/Throat:     Mouth: Mucous membranes are moist.     Pharynx:  Oropharynx is clear. Posterior oropharyngeal erythema present.  Eyes:     General: No scleral icterus.    Conjunctiva/sclera: Conjunctivae normal.  Cardiovascular:     Rate and Rhythm: Normal rate and regular rhythm.     Heart sounds: Normal heart sounds.  Pulmonary:     Effort: Pulmonary effort is normal. No respiratory distress.     Breath sounds: Normal breath sounds.  Musculoskeletal:     Cervical back: Neck supple.     Comments: BACK: Reduced ROM. TTP diffusely throughout lumbar region.   Skin:    General: Skin is warm and dry.     Capillary Refill: Capillary refill takes less than 2 seconds.  Neurological:     General: No focal deficit present.     Mental Status: He is alert. Mental status is at baseline.     Motor: No weakness.     Gait: Gait normal.  Psychiatric:        Mood and Affect: Mood normal.        Behavior: Behavior normal.      UC Treatments / Results  Labs (all labs ordered are listed, but only abnormal results are displayed) Labs Reviewed  RESP PANEL BY RT-PCR (FLU A&B, COVID) ARPGX2 - Abnormal; Notable for the following components:      Result Value   Influenza A by PCR POSITIVE (*)    All other components within normal limits    EKG   Radiology No results found.  Procedures Procedures (including critical care time)  Medications Ordered in UC Medications  ketorolac (TORADOL) 30 MG/ML injection 30 mg (30 mg Intramuscular Given 12/07/23 1624)    Initial Impression / Assessment and Plan / UC Course  I have reviewed the triage vital signs and the nursing notes.  Pertinent labs & imaging results that were available during my care of the patient were reviewed by me and considered in my medical decision making (see chart for details).   58 year old male presents for 2 separate issues.  Reports fever, fatigue, cough, congestion, sore throat and shortness of breath x 2 days.  Also reports flareup of chronic underlying back pain for the past couple  of days.  Seen here on 11/24/2023 and given ketorolac injection which was helpful.  Has an appointment with orthopedics in about 2 weeks.  No red flag signs or symptoms for back issue.  Current temp  101.6 degrees.  Patient is ill-appearing but nontoxic.  On exam has nasal congestion and erythema posterior pharynx.  Chest clear.  Heart regular rate and rhythm.  Tenderness throughout entire lumbar region.  Reduced range of motion of back.  Respiratory panel obtained.  Positive influenza A.  Reviewed result with patient.  Reviewed current CDC guidelines, isolation protocol and ED precautions for flu.  Sent Tamiflu and Promethazine DM to pharmacy.  Patient given 30 mg IM ketorolac for acute flareup of chronic underlying back pain.  Advised against with home Tylenol, heat, ice, stretching and follow-up with orthopedics as scheduled.  ED precautions for back pain listed in handout.  Acute illness with systemic symptoms and flareup of chronic underlying condition.   Final Clinical Impressions(s) / UC Diagnoses   Final diagnoses:  Influenza A  Fever, unspecified  Acute cough  Chronic bilateral low back pain without sciatica     Discharge Instructions      -Pending flu, COVID and strep testing.  Will contact you with any positive results. - I expect your flu test will be positive.  You are within the window for treatment Tamiflu to potentially be helpful so I sent it to the pharmacy if you are positive. - Sent cough medicine. - You need to isolate until you are fever free for 24 hours and symptoms are improving. - Increase rest and fluids. - You should be seen again if you have uncontrolled fever, weakness or worsening breathing problem.  BACK PAIN: Stressed avoiding painful activities . RICE (REST, ICE, COMPRESSION, ELEVATION) guidelines reviewed. May alternate ice and heat. Consider use of muscle rubs, Salonpas patches, etc. Use medications as directed including muscle relaxers if prescribed.  Take medications as prescribed or OTC NSAIDs/Tylenol.  F/u with ortho as scheduled.  BACK PAIN RED FLAGS: If the back pain acutely worsens or there are any red flag symptoms such as numbness/tingling, leg weakness, saddle anesthesia, or loss of bowel/bladder control, go immediately to the ER. Follow up with [place] as scheduled or sooner if the pain does not begin to resolve or if it worsens before the follow up       ED Prescriptions     Medication Sig Dispense Auth. Provider   oseltamivir (TAMIFLU) 6 MG/ML SUSR suspension Take 12.5 mLs (75 mg total) by mouth 2 (two) times daily for 5 days. 125 mL Eusebio Friendly B, PA-C   promethazine-dextromethorphan (PROMETHAZINE-DM) 6.25-15 MG/5ML syrup Take 5 mLs by mouth 4 (four) times daily as needed. 118 mL Shirlee Latch, PA-C      PDMP not reviewed this encounter.   Shirlee Latch, PA-C 12/07/23 [phone]

## 2023-12-07 NOTE — Discharge Instructions (Signed)
-  Pending flu, COVID and strep testing.  Will contact you with any positive results. - I expect your flu test will be positive.  You are within the window for treatment Tamiflu to potentially be helpful so I sent it to the pharmacy if you are positive. - Sent cough medicine. - You need to isolate until you are fever free for 24 hours and symptoms are improving. - Increase rest and fluids. - You should be seen again if you have uncontrolled fever, weakness or worsening breathing problem.  BACK PAIN: Stressed avoiding painful activities . RICE (REST, ICE, COMPRESSION, ELEVATION) guidelines reviewed. May alternate ice and heat. Consider use of muscle rubs, Salonpas patches, etc. Use medications as directed including muscle relaxers if prescribed. Take medications as prescribed or OTC NSAIDs/Tylenol.  F/u with ortho as scheduled.  BACK PAIN RED FLAGS: If the back pain acutely worsens or there are any red flag symptoms such as numbness/tingling, leg weakness, saddle anesthesia, or loss of bowel/bladder control, go immediately to the ER. Follow up with [place] as scheduled or sooner if the pain does not begin to resolve or if it worsens before the follow up

## 2023-12-07 NOTE — ED Triage Notes (Signed)
Pt c/o back pain. He was recently seen on 11/24/23 and had a Toradol shot and states that helped. He also states he has cough, body aches, chills, fever. Started about 2 days ago.

## 2024-09-22 ENCOUNTER — Ambulatory Visit
Admission: EM | Admit: 2024-09-22 | Discharge: 2024-09-22 | Disposition: A | Discharge status: Home / Self Care | Attending: Emergency Medicine | Admitting: Emergency Medicine

## 2024-09-22 ENCOUNTER — Encounter: Payer: Self-pay | Admitting: Emergency Medicine

## 2024-09-22 DIAGNOSIS — R197 Diarrhea, unspecified: Secondary | ICD-10-CM

## 2024-09-22 MED ORDER — DIPHENOXYLATE-ATROPINE 2.5-0.025 MG PO TABS: 1.0000 | ORAL_TABLET | Freq: Four times a day (QID) | ORAL | 0 refills | Status: AC | PRN

## 2024-09-22 MED ORDER — ONDANSETRON 8 MG PO TBDP: ORAL_TABLET | ORAL | 0 refills | Status: AC

## 2024-09-22 NOTE — ED Provider Notes (Signed)
 HPI  SUBJECTIVE:  Justin Bramble. is a 58 y.o. male who presents with multiple episodes of watery, nonbloody diarrhea starting 24 hours ago.  He went to [place]  over the weekend and ate at 2 restaurants.  No raw or undercooked foods, questionable leftovers, recent international travel, camping, sick contacts, recent antibiotics, exposure to chickens or reptiles.  No recent change in his medications.  No vomiting, nausea, fevers, abdominal pain.  He reports some mild upper abdominal tightness and decreased appetite.  No abdominal distention, increased thirst, lightheadedness, dizziness, presyncope, syncope.  He tried Imodium without relief.  Symptoms are worse with any p.o. intake.  He has a past medical history of hypertension.  No history of abdominal surgeries, infectious diarrhea, C. difficile, Crohn's, IBS.  PCP: [facility].    Past Medical History:  Diagnosis Date   Hypertension    Vertigo     Past Surgical History:  Procedure Laterality Date   MANDIBLE SURGERY     SHOULDER ARTHROSCOPY     WRIST FRACTURE SURGERY      Family History  Problem Relation Age of Onset   Diabetes Mother     Social History   Tobacco Use   Smoking status: Never   Smokeless tobacco: Never  Vaping Use   Vaping status: Never Used  Substance Use Topics   Alcohol use: No   Drug use: No    No current facility-administered medications for this encounter.  Current Outpatient Medications:    diphenoxylate-atropine (LOMOTIL) 2.5-0.025 MG tablet, Take 1 tablet by mouth 4 (four) times daily as needed for up to 3 days for diarrhea or loose stools., Disp: 9 tablet, Rfl: 0   EPINEPHrine 0.3 mg/0.3 mL IJ SOAJ injection, Inject 0.3 mg into the muscle as needed for anaphylaxis., Disp: , Rfl:    ondansetron  (ZOFRAN -ODT) 8 MG disintegrating tablet, 1/2- 1 tablet q 8 hr prn nausea, vomiting, Disp: 20 tablet, Rfl: 0   omeprazole (PRILOSEC) 40 MG capsule, Take 40 mg by mouth daily.,  Disp: , Rfl:    sildenafil (VIAGRA) 25 MG tablet, TAKE 1 TO 2 TABLETS BY MOUTH AS  NEEDED, Disp: , Rfl:    triamterene-hydrochlorothiazide (DYAZIDE) 37.5-25 MG capsule, Take 1 capsule by mouth daily., Disp: , Rfl:   Allergies  Allergen Reactions   Corn Oil Other (See Comments)    abdomimanl swelling   Corn-Containing Products    Shellfish Allergy Hives and Rash     ROS  As noted in HPI.   Physical Exam  BP 126/87 (BP Location: Left Arm)   Pulse 62   Temp 98.4 F (36.9 C) (Oral)   Resp 15   Wt 91.6 kg   SpO2 98%   BMI 28.98 kg/m   Constitutional: Well developed, well nourished, no acute distress Eyes:  EOMI, conjunctiva normal bilaterally HENT: Normocephalic, atraumatic,mucus membranes moist Respiratory: Normal inspiratory effort Cardiovascular: Normal rate.  Cap refill less than 2 seconds GI: nondistended, soft, nontender.  Active bowel sounds.  No rebound, guarding. skin: No rash, skin intact Musculoskeletal: no deformities Neurologic: Alert & oriented x 3, no focal neuro deficits Psychiatric: Speech and behavior appropriate   ED Course   Medications - No data to display  No orders of the defined types were placed in this encounter.   No results found for this or any previous visit (from the past 24 hours). No results found.  ED Clinical Impression  1. Diarrhea, unspecified type      ED Assessment/Plan  Reviewed [place]  narcotic database.  Feel it is appropriate to proceed with prescription for controlled substance.  Last opiate prescription in 2024 for some Tussionex  Suspect viral diarrhea.  Abdomen is benign.  Low suspicion for invasive diarrhea and thus stool cultures not done today.  Cannot identify any specific cause that might warrant antibiotics.  Will send home with Lomotil, Zofran , push electrolyte containing fluids.  Work note.  ER return precautions given.  Discussed MDM, treatment plan, and plan for follow-up with patient.  Discussed sn/sx that should prompt return to the ED. patient agrees with plan.   Meds ordered this encounter  Medications   diphenoxylate-atropine (LOMOTIL) 2.5-0.025 MG tablet    Sig: Take 1 tablet by mouth 4 (four) times daily as needed for up to 3 days for diarrhea or loose stools.    Dispense:  9 tablet    Refill:  0   ondansetron  (ZOFRAN -ODT) 8 MG disintegrating tablet    Sig: 1/2- 1 tablet q 8 hr prn nausea, vomiting    Dispense:  20 tablet    Refill:  0      *This clinic note was created using Scientist, clinical (histocompatibility and immunogenetics). Therefore, there may be occasional mistakes despite careful proofreading.  ?    Van Knee, MD 09/22/24 1843

## 2024-09-22 NOTE — Discharge Instructions (Addendum)
 Push electrolyte containing fluids such as Pedialyte, Gatorade.  Try the Zofran  first, if that does not work, then Lomotil.  Follow-up with your primary care provider as needed.  Go to the ER for fevers above 100.4, abdominal pain, blood in your stools, if you have not urinated in 12 hours, lightheadedness, dizziness, passing out, or for other concerns

## 2024-09-22 NOTE — ED Triage Notes (Signed)
 Pt c/o liquid yellow tool for the past 23 hours. Pt denies any other symptoms.
